# Patient Record
Sex: Female | Born: 1960
Health system: Southern US, Community
[De-identification: ages and names within clinical notes are randomized; demographics above are authoritative.]

## PROBLEM LIST (undated history)

## (undated) DIAGNOSIS — R011 Cardiac murmur, unspecified: Secondary | ICD-10-CM

## (undated) DIAGNOSIS — R51 Headache: Secondary | ICD-10-CM

## (undated) DIAGNOSIS — F32A Depression, unspecified: Secondary | ICD-10-CM

## (undated) DIAGNOSIS — D649 Anemia, unspecified: Secondary | ICD-10-CM

## (undated) DIAGNOSIS — K219 Gastro-esophageal reflux disease without esophagitis: Secondary | ICD-10-CM

## (undated) DIAGNOSIS — M542 Cervicalgia: Secondary | ICD-10-CM

## (undated) DIAGNOSIS — M1990 Unspecified osteoarthritis, unspecified site: Secondary | ICD-10-CM

## (undated) DIAGNOSIS — R519 Headache, unspecified: Secondary | ICD-10-CM

## (undated) DIAGNOSIS — M549 Dorsalgia, unspecified: Secondary | ICD-10-CM

## (undated) DIAGNOSIS — F329 Major depressive disorder, single episode, unspecified: Secondary | ICD-10-CM

## (undated) HISTORY — PX: OTHER SURGICAL HISTORY: SHX169

## (undated) HISTORY — PX: CERVICAL FUSION: SHX112

## (undated) HISTORY — PX: SHOULDER SURGERY: SHX246

## (undated) HISTORY — PX: ABDOMINAL HYSTERECTOMY: SHX81

## (undated) HISTORY — PX: TUBAL LIGATION: SHX77

---

## 1997-11-10 ENCOUNTER — Encounter: Admission: RE | Admit: 1997-11-10 | Discharge: 1998-02-08 | Payer: Self-pay | Admitting: *Deleted

## 1998-02-17 ENCOUNTER — Emergency Department (HOSPITAL_COMMUNITY): Admission: EM | Admit: 1998-02-17 | Discharge: 1998-02-17 | Payer: Self-pay | Admitting: Emergency Medicine

## 1998-02-22 ENCOUNTER — Encounter: Admission: RE | Admit: 1998-02-22 | Discharge: 1998-05-23 | Payer: Self-pay | Admitting: Orthopedic Surgery

## 1999-06-07 ENCOUNTER — Emergency Department (HOSPITAL_COMMUNITY): Admission: EM | Admit: 1999-06-07 | Discharge: 1999-06-07 | Payer: Self-pay | Admitting: *Deleted

## 1999-06-08 ENCOUNTER — Encounter: Payer: Self-pay | Admitting: *Deleted

## 1999-07-03 ENCOUNTER — Ambulatory Visit (HOSPITAL_COMMUNITY): Admission: RE | Admit: 1999-07-03 | Discharge: 1999-07-03 | Payer: Self-pay | Admitting: Obstetrics and Gynecology

## 1999-07-03 ENCOUNTER — Encounter (INDEPENDENT_AMBULATORY_CARE_PROVIDER_SITE_OTHER): Payer: Self-pay

## 1999-10-11 ENCOUNTER — Other Ambulatory Visit: Admission: RE | Admit: 1999-10-11 | Discharge: 1999-10-11 | Payer: Self-pay | Admitting: Obstetrics and Gynecology

## 2000-02-15 ENCOUNTER — Emergency Department (HOSPITAL_COMMUNITY): Admission: EM | Admit: 2000-02-15 | Discharge: 2000-02-16 | Payer: Self-pay

## 2000-02-28 ENCOUNTER — Emergency Department (HOSPITAL_COMMUNITY): Admission: EM | Admit: 2000-02-28 | Discharge: 2000-02-28 | Payer: Self-pay | Admitting: Emergency Medicine

## 2000-10-13 ENCOUNTER — Other Ambulatory Visit: Admission: RE | Admit: 2000-10-13 | Discharge: 2000-10-13 | Payer: Self-pay | Admitting: Obstetrics and Gynecology

## 2001-11-12 ENCOUNTER — Other Ambulatory Visit: Admission: RE | Admit: 2001-11-12 | Discharge: 2001-11-12 | Payer: Self-pay | Admitting: Obstetrics and Gynecology

## 2002-11-15 ENCOUNTER — Other Ambulatory Visit: Admission: RE | Admit: 2002-11-15 | Discharge: 2002-11-15 | Payer: Self-pay | Admitting: Obstetrics and Gynecology

## 2004-11-12 ENCOUNTER — Other Ambulatory Visit: Admission: RE | Admit: 2004-11-12 | Discharge: 2004-11-12 | Payer: Self-pay | Admitting: Obstetrics and Gynecology

## 2004-12-26 ENCOUNTER — Emergency Department (HOSPITAL_COMMUNITY): Admission: EM | Admit: 2004-12-26 | Discharge: 2004-12-26 | Payer: Self-pay | Admitting: Emergency Medicine

## 2005-01-08 ENCOUNTER — Encounter: Admission: RE | Admit: 2005-01-08 | Discharge: 2005-02-25 | Payer: Self-pay | Admitting: Occupational Medicine

## 2005-11-13 ENCOUNTER — Other Ambulatory Visit: Admission: RE | Admit: 2005-11-13 | Discharge: 2005-11-13 | Payer: Self-pay | Admitting: Obstetrics and Gynecology

## 2006-06-03 ENCOUNTER — Ambulatory Visit (HOSPITAL_COMMUNITY): Admission: RE | Admit: 2006-06-03 | Discharge: 2006-06-03 | Payer: Self-pay | Admitting: Obstetrics and Gynecology

## 2006-06-03 ENCOUNTER — Encounter (INDEPENDENT_AMBULATORY_CARE_PROVIDER_SITE_OTHER): Payer: Self-pay | Admitting: Specialist

## 2010-12-28 NOTE — H&P (Signed)
[name], [name]                ACCOUNT NO.:  [id number]   MEDICAL RECORD NO.:  [id number]          PATIENT TYPE:  AMB   LOCATION:  SDC                           FACILITY:  [facility]   PHYSICIAN:  Zenaida Niece, M.D.DATE OF BIRTH:  1961/07/14   DATE OF ADMISSION:  06/03/2006  DATE OF DISCHARGE:                                HISTORY & PHYSICAL   CHIEF COMPLAINT:  Menorrhagia and dysmenorrhea.   HISTORY OF PRESENT ILLNESS:  This is a 50 year old black female, para 3-0-0-  3, who was seen for an annual exam in April 2007.  She has had a  longstanding history of menorrhagia and dysmenorrhea, and at that time had  breakthrough bleeding on the patch and Loestrin 24 but was currently at that  time doing well on Yasmin.  All options were discussed with her, and she  decided to continue the Yasmin.  She then started having irregular bleeding  on the Yasmin and has expressed the desire to proceed with definitive  surgical therapy.  Endometrial biopsy performed on October 16 reveals benign  endometrial tissue with focal features suggestive of an endometrial polyp.  Again, all options have been discussed with the patient, and she wishes to  proceed with definitive surgical therapy.   PAST OB HISTORY:  Significant for 3 vaginal deliveries at term without  complications.   PAST GYN HISTORY:  The patient does have a history of pelvic pain, and in  2000 underwent a laparoscopy with ovarian biopsy and fulguration of lesions  possibly consistent with endometriosis.  She was on Depo-Provera for several  years after that but changed to the birth control patch in 2005 due to the  elevated risk of bone loss with continued use of Depo-Provera.   PAST MEDICAL HISTORY:  Otherwise negative.   PAST SURGICAL HISTORY:  Significant for left shoulder surgery, the  diagnostic laparoscopy, a tubal ligation, and toe surgery.   ALLERGIES:  None known.   CURRENT MEDICATIONS:  Hydrocodone p.r.n.   SOCIAL  HISTORY:  She is married, and she denies alcohol, tobacco, or drug  use.   FAMILY HISTORY:  No GYN or colon cancer.   REVIEW OF SYSTEMS:  She has normal bowel and bladder function.   PHYSICAL EXAMINATION:  VITAL SIGNS: Weight is approximately 140 pounds.  Vital signs are stable.  GENERAL: This is a well-developed black female in no acute distress.  NECK: Supple without lymphadenopathy or thyromegaly.  LUNGS: Clear to auscultation.  HEART:  Regular rate and rhythm without murmur.  ABDOMEN: Soft, nontender, nondistended without palpable masses.  EXTREMITIES:  Have no edema and are nontender.  PELVIC:  External genitalia has no lesion.  On speculum exam, she has a  normal cervix, and Pap smears have been normal with negative high-risk HPV  in 2007.  Bimanual exam shows a small retroverted, nontender uterus and no  adnexal masses, and this is confirmed by rectovaginal exam.   ASSESSMENT:  Persistent menorrhagia and dysmenorrhea with a possible  endometrial polyp by endometrial biopsy.  All options have been discussed  with the patient, and she wishes  to proceed with definitive surgical  therapy.  Risks of surgery have been discussed.   PLAN:  Admit the patient on the day of surgery for laparoscopic  supracervical hysterectomy.      Zenaida Niece, M.D.  Electronically Signed     TDM/MEDQ  D:  06/02/2006  T:  06/02/2006  Job:  [id number]

## 2010-12-28 NOTE — Op Note (Signed)
[name], [name]                ACCOUNT NO.:  [id number]   MEDICAL RECORD NO.:  [id number]          PATIENT TYPE:  OIB   LOCATION:  9316                          [facility]ACILITY:  [facility]   PHYSICIAN:  Zenaida Niece, M.D.DATE OF BIRTH:  1960/10/06   DATE OF PROCEDURE:  06/03/2006  DATE OF DISCHARGE:  06/03/2006                                 OPERATIVE REPORT   PREOPERATIVE AND POSTOPERATIVE DIAGNOSES:  Menorrhagia and dysmenorrhea.   PROCEDURE:  Laparoscopic supracervical hysterectomy.   SURGEON:  Zenaida Niece, M.D.   ASSISTANT:  Sherron Monday, MD   ANESTHESIA:  General endotracheal tube.   SPECIMENS:  Uterus in pieces sent to pathology.   ESTIMATED BLOOD LOSS:  100 mL.   COMPLICATIONS:  None.   FINDINGS:  She had normal appearing appendix, liver edge, gallbladder and  stomach.  Pelvis had normal tubes and ovaries with evidence of prior tubal  ligation.  Uterus was retroverted and appeared to have possible fibroids.   PROCEDURE IN DETAIL:  The patient was taken to the operating room and placed  in the dorsosupine position.  General anesthesia was induced and she was  placed in mobile stirrups and both arms tucked to her side.  The abdomen,  perineum and vagina were then prepped and draped in the usual sterile  fashion and a Foley catheter inserted.  Her infraumbilical skin was  infiltrated with quarter percent Marcaine and 1 cm horizontal incision was  made in her prior incision.  I eventually needed a disposable Veress needle  to enter the peritoneal cavity and this was confirmed by the water drop test  and opening pressure 6 mmHg.  CO2 gas was insufflated to a pressure of 14  mmHg and the Veress needle was removed.  A 5-mm XL trocar was then  introduced through this incision with direct visualization with the  laparoscope.  Inspection revealed the above-mentioned findings.  A 5-mm port  was then placed on the right side under direct visualization and a 12-mm  port on the  left side under direct visualization.  From the left side, the  uterus was grasped with a single-tooth tenaculum and the utero-ovarian  pedicles, round ligament and broad ligaments were taken down with harmonic  scalpel Ace.  The anterior peritoneum was incised across the anterior  portion of the uterus and the bladder pushed inferior.  The uterine arteries  were skeletonized but not taken at this time.  A similar procedure was then  performed from the patient's right side.  The uterine arteries were  skeletonized and taken down with harmonic scalpel Ace with adequate  hemostasis.  Attention was turned back to the left side where the uterine  arteries were skeletonized and taken down with a harmonic scalpel.  Two  bites were taken into the lower uterine segment with a drill, clamp, cut  technique with the harmonic scalpel Ace.  We then returned to the right side  and came across most of the cervix from the right side just above the level  of the uterosacral ligaments.  Then came back from the left  side and  connected to the incision made from the right side and the uterus was freed  from the cervix.  The pelvis was copiously irrigated and bleeding from the  patient's right side cervix was controlled with bipolar cautery and with  Harmonic scalpel.  The 12 mm trocar was then removed and the morcellator was  placed with direct visualization.  The uterus was then removed in several  pieces with good visualization through the morcellator.  Small pieces were  removed with a laparoscopic spoon.  Pelvis was copiously irrigated and found  to be hemostatic.  Tubes and ovaries appeared normal.  A piece of Interceed  was then placed over the cervical stump after the endocervical canal had  been coagulated with the harmonic scalpel Ace.  This achieved good closure  and there was adequate hemostasis.  Laparoscopic trocars were removed under  direct visualization.  All gas allowed to deflate from the  abdomen.  The  fascia on the 12 mm port was closed with figure-of-eight suture of 0 Vicryl.  Skin incisions were closed with interrupted subcuticular sutures of 4-0  Vicryl followed by Dermabond.  The Foley catheter was then removed.  The  patient was extubated in the operating room and taken to the recovery room  in stable condition after tolerating the procedure well.  Counts were  correct x2, she received Mefoxin 1 gram IV prior to the procedure and had  PAS hose on throughout the procedure.      Zenaida Niece, M.D.  Electronically Signed     TDM/MEDQ  D:  06/03/2006  T:  06/04/2006  Job:  [id number]

## 2014-12-19 ENCOUNTER — Other Ambulatory Visit: Payer: Self-pay | Admitting: Orthopedic Surgery

## 2014-12-19 DIAGNOSIS — M5412 Radiculopathy, cervical region: Secondary | ICD-10-CM

## 2014-12-28 ENCOUNTER — Inpatient Hospital Stay
Admission: RE | Admit: 2014-12-28 | Discharge: 2014-12-28 | Disposition: A | Discharge status: Home / Self Care | Payer: Self-pay | Source: Ambulatory Visit | Attending: Orthopedic Surgery | Admitting: Orthopedic Surgery

## 2014-12-28 ENCOUNTER — Other Ambulatory Visit: Payer: Self-pay

## 2014-12-28 NOTE — Discharge Instructions (Signed)

## 2015-01-02 ENCOUNTER — Other Ambulatory Visit: Payer: Self-pay | Admitting: Orthopedic Surgery

## 2015-01-02 ENCOUNTER — Ambulatory Visit
Admission: RE | Admit: 2015-01-02 | Discharge: 2015-01-02 | Disposition: A | Discharge status: Home / Self Care | Payer: Worker's Compensation | Source: Ambulatory Visit | Attending: Orthopedic Surgery | Admitting: Orthopedic Surgery

## 2015-01-02 ENCOUNTER — Ambulatory Visit
Admission: RE | Admit: 2015-01-02 | Discharge: 2015-01-02 | Disposition: A | Discharge status: Home / Self Care | Payer: Self-pay | Source: Ambulatory Visit | Attending: Orthopedic Surgery | Admitting: Orthopedic Surgery

## 2015-01-02 DIAGNOSIS — M5412 Radiculopathy, cervical region: Secondary | ICD-10-CM

## 2015-01-02 MED ORDER — IOHEXOL 300 MG/ML  SOLN
10.0000 mL | Freq: Once | INTRAMUSCULAR | Status: AC | PRN
  Administered 2015-01-02: 10 mL via INTRATHECAL

## 2015-01-02 MED ORDER — DIAZEPAM 5 MG PO TABS
10.0000 mg | ORAL_TABLET | Freq: Once | ORAL | Status: AC
  Administered 2015-01-02: 5 mg via ORAL

## 2015-01-02 NOTE — Discharge Instructions (Signed)

## 2015-04-25 ENCOUNTER — Emergency Department (HOSPITAL_COMMUNITY)
Admission: EM | Admit: 2015-04-25 | Discharge: 2015-04-25 | Disposition: A | Discharge status: Home / Self Care | Payer: Federal, State, Local not specified - PPO | Attending: Emergency Medicine | Admitting: Emergency Medicine

## 2015-04-25 ENCOUNTER — Encounter (HOSPITAL_COMMUNITY): Payer: Self-pay | Admitting: *Deleted

## 2015-04-25 ENCOUNTER — Emergency Department (HOSPITAL_COMMUNITY): Payer: Federal, State, Local not specified - PPO

## 2015-04-25 DIAGNOSIS — S3992XA Unspecified injury of lower back, initial encounter: Secondary | ICD-10-CM | POA: Insufficient documentation

## 2015-04-25 DIAGNOSIS — M545 Low back pain, unspecified: Secondary | ICD-10-CM

## 2015-04-25 DIAGNOSIS — S0990XA Unspecified injury of head, initial encounter: Secondary | ICD-10-CM | POA: Insufficient documentation

## 2015-04-25 DIAGNOSIS — Y998 Other external cause status: Secondary | ICD-10-CM | POA: Diagnosis not present

## 2015-04-25 DIAGNOSIS — Y92410 Unspecified street and highway as the place of occurrence of the external cause: Secondary | ICD-10-CM | POA: Diagnosis not present

## 2015-04-25 DIAGNOSIS — Y9389 Activity, other specified: Secondary | ICD-10-CM | POA: Diagnosis not present

## 2015-04-25 DIAGNOSIS — Z8739 Personal history of other diseases of the musculoskeletal system and connective tissue: Secondary | ICD-10-CM | POA: Diagnosis not present

## 2015-04-25 HISTORY — DX: Unspecified osteoarthritis, unspecified site: M19.90

## 2015-04-25 HISTORY — DX: Cervicalgia: M54.2

## 2015-04-25 HISTORY — DX: Dorsalgia, unspecified: M54.9

## 2015-04-25 MED ORDER — PREDNISONE 20 MG PO TABS: 40.0000 mg | ORAL_TABLET | Freq: Every day | ORAL | Status: DC

## 2015-04-25 MED ORDER — KETOROLAC TROMETHAMINE 60 MG/2ML IM SOLN
60.0000 mg | Freq: Once | INTRAMUSCULAR | Status: AC
  Administered 2015-04-25: 60 mg via INTRAMUSCULAR
  Filled 2015-04-25: qty 2
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
## 2015-11-14 DIAGNOSIS — N951 Menopausal and female climacteric states: Secondary | ICD-10-CM | POA: Diagnosis not present

## 2015-11-14 DIAGNOSIS — Z1389 Encounter for screening for other disorder: Secondary | ICD-10-CM | POA: Diagnosis not present

## 2015-11-14 DIAGNOSIS — Z6826 Body mass index (BMI) 26.0-26.9, adult: Secondary | ICD-10-CM | POA: Diagnosis not present

## 2015-11-14 DIAGNOSIS — Z130 Encounter for screening for diseases of the blood and blood-forming organs and certain disorders involving the immune mechanism: Secondary | ICD-10-CM | POA: Diagnosis not present

## 2015-11-14 DIAGNOSIS — Z01419 Encounter for gynecological examination (general) (routine) without abnormal findings: Secondary | ICD-10-CM | POA: Diagnosis not present

## 2015-11-20 DIAGNOSIS — Z1231 Encounter for screening mammogram for malignant neoplasm of breast: Secondary | ICD-10-CM | POA: Diagnosis not present

## 2016-01-04 DIAGNOSIS — Z79899 Other long term (current) drug therapy: Secondary | ICD-10-CM | POA: Diagnosis not present

## 2016-01-04 DIAGNOSIS — I10 Essential (primary) hypertension: Secondary | ICD-10-CM | POA: Diagnosis not present

## 2016-01-04 DIAGNOSIS — D649 Anemia, unspecified: Secondary | ICD-10-CM | POA: Diagnosis not present

## 2016-01-04 DIAGNOSIS — E785 Hyperlipidemia, unspecified: Secondary | ICD-10-CM | POA: Diagnosis not present

## 2016-02-22 DIAGNOSIS — K080 Exfoliation of teeth due to systemic causes: Secondary | ICD-10-CM | POA: Diagnosis not present

## 2016-06-26 DIAGNOSIS — K080 Exfoliation of teeth due to systemic causes: Secondary | ICD-10-CM | POA: Diagnosis not present

## 2016-07-29 DIAGNOSIS — J209 Acute bronchitis, unspecified: Secondary | ICD-10-CM | POA: Diagnosis not present

## 2016-09-12 ENCOUNTER — Other Ambulatory Visit: Payer: Self-pay | Admitting: Orthopedic Surgery

## 2016-09-18 NOTE — Pre-Procedure Instructions (Addendum)
    [name]  09/18/2016      [id number] [name], [place] [address] Phone: [phone] Fax: [phone]    Your procedure is scheduled on September 26, 2016   Report to [facility]             (posted surgery time 7:30 am - 9:32 am)   Call this number if you have problems the morning of surgery:  [phone]   Remember:  Do not eat food or drink liquids after midnight.  Take these medicines the morning of surgery with A SIP OF WATER ceterizine (zyrtec), Duloxetine (cymbalta).             4-5 days prior to surgery, STOP taking any Vitamins, Herbal Supplements, Anti-inflammatories.   Do not wear jewelry, make-up or nail polish.  Do not wear lotions, powders, or perfumes, or deoderant.  Do not shave 48 hours prior to surgery.  Do not bring valuables to the hospital.  [facility] is not responsible for any belongings or valuables.  Contacts, dentures or bridgework may not be worn into surgery.  Leave your suitcase in the car.  After surgery it may be brought to your room. For patients admitted to the hospital, discharge time will be determined by your treatment team.  Please read over the following fact sheets that you were given. Pain Booklet, MRSA Information and Surgical Site Infection Prevention

## 2016-09-19 ENCOUNTER — Encounter (HOSPITAL_COMMUNITY)
Admission: RE | Admit: 2016-09-19 | Discharge: 2016-09-19 | Disposition: A | Discharge status: Home / Self Care | Payer: Federal, State, Local not specified - PPO | Source: Ambulatory Visit | Attending: Orthopedic Surgery | Admitting: Orthopedic Surgery

## 2016-09-19 ENCOUNTER — Encounter (HOSPITAL_COMMUNITY): Payer: Self-pay

## 2016-09-19 ENCOUNTER — Ambulatory Visit (HOSPITAL_COMMUNITY)
Admission: RE | Admit: 2016-09-19 | Discharge: 2016-09-19 | Disposition: A | Discharge status: Home / Self Care | Source: Ambulatory Visit | Attending: Orthopedic Surgery | Admitting: Orthopedic Surgery

## 2016-09-19 ENCOUNTER — Encounter (HOSPITAL_COMMUNITY): Admission: RE | Admit: 2016-09-19 | Payer: Federal, State, Local not specified - PPO | Source: Ambulatory Visit

## 2016-09-19 DIAGNOSIS — Z01818 Encounter for other preprocedural examination: Secondary | ICD-10-CM | POA: Diagnosis not present

## 2016-09-19 HISTORY — DX: Depression, unspecified: F32.A

## 2016-09-19 HISTORY — DX: Headache, unspecified: R51.9

## 2016-09-19 HISTORY — DX: Cardiac murmur, unspecified: R01.1

## 2016-09-19 HISTORY — DX: Gastro-esophageal reflux disease without esophagitis: K21.9

## 2016-09-19 HISTORY — DX: Anemia, unspecified: D64.9

## 2016-09-19 HISTORY — DX: Headache: R51

## 2016-09-19 HISTORY — DX: Major depressive disorder, single episode, unspecified: F32.9

## 2016-09-19 LAB — PROTIME-INR
INR: 1
Prothrombin Time: 13.2 seconds (ref 11.4–15.2)

## 2016-09-19 LAB — CBC WITH DIFFERENTIAL/PLATELET
BASOS PCT: 1 %
Basophils Absolute: 0.1 10*3/uL (ref 0.0–0.1)
Eosinophils Absolute: 0.1 10*3/uL (ref 0.0–0.7)
Eosinophils Relative: 1 %
HEMATOCRIT: 37.9 % (ref 36.0–46.0)
HEMOGLOBIN: 11.7 g/dL — AB (ref 12.0–15.0)
Lymphocytes Relative: 44 %
Lymphs Abs: 2.4 10*3/uL (ref 0.7–4.0)
MCH: 21 pg — ABNORMAL LOW (ref 26.0–34.0)
MCHC: 30.9 g/dL (ref 30.0–36.0)
MCV: 68.2 fL — ABNORMAL LOW (ref 78.0–100.0)
Monocytes Absolute: 0.2 10*3/uL (ref 0.1–1.0)
Monocytes Relative: 4 %
NEUTROS ABS: 2.7 10*3/uL (ref 1.7–7.7)
NEUTROS PCT: 50 %
Platelets: 268 10*3/uL (ref 150–400)
RBC: 5.56 MIL/uL — ABNORMAL HIGH (ref 3.87–5.11)
RDW: 15.3 % (ref 11.5–15.5)
WBC: 5.5 10*3/uL (ref 4.0–10.5)

## 2016-09-19 LAB — URINALYSIS, ROUTINE W REFLEX MICROSCOPIC
Bilirubin Urine: NEGATIVE
GLUCOSE, UA: NEGATIVE mg/dL
Hgb urine dipstick: NEGATIVE
Ketones, ur: NEGATIVE mg/dL
Leukocytes, UA: NEGATIVE
Nitrite: NEGATIVE
PH: 5 (ref 5.0–8.0)
Protein, ur: NEGATIVE mg/dL
Specific Gravity, Urine: 1.017 (ref 1.005–1.030)

## 2016-09-19 LAB — COMPREHENSIVE METABOLIC PANEL
ALBUMIN: 3.7 g/dL (ref 3.5–5.0)
ALK PHOS: 64 U/L (ref 38–126)
ALT: 18 U/L (ref 14–54)
ANION GAP: 9 (ref 5–15)
AST: 26 U/L (ref 15–41)
BILIRUBIN TOTAL: 0.6 mg/dL (ref 0.3–1.2)
BUN: 11 mg/dL (ref 6–20)
CALCIUM: 9.3 mg/dL (ref 8.9–10.3)
CO2: 25 mmol/L (ref 22–32)
CREATININE: 0.85 mg/dL (ref 0.44–1.00)
Chloride: 105 mmol/L (ref 101–111)
GFR calc Af Amer: >60 mL/min (ref >60)
GFR calc non Af Amer: >60 mL/min (ref >60)
GLUCOSE: 105 mg/dL — AB (ref 65–99)
Potassium: 4.2 mmol/L (ref 3.5–5.1)
Sodium: 139 mmol/L (ref 135–145)
Total Protein: 7 g/dL (ref 6.5–8.1)

## 2016-09-19 LAB — APTT: aPTT: 30 seconds (ref 24–36)

## 2016-09-19 LAB — TYPE AND SCREEN
ABO/RH(D): O POS
ANTIBODY SCREEN: NEGATIVE

## 2016-09-19 LAB — SURGICAL PCR SCREEN
MRSA, PCR: NEGATIVE
Staphylococcus aureus: NEGATIVE

## 2016-09-19 LAB — ABO/RH: ABO/RH(D): O POS

## 2016-09-19 NOTE — Progress Notes (Addendum)
PCP is Dr. Matilde Haymaker @ [facility] practice.  LOV 07/2016  (they do not have an EKG for comparison, so I requested it from the [place]) She also has a [place] [facility]) -- Dr. Henrene Pastor   LOV 05/2016  [phone] Pain management is Dr. Darral Dash LOV 08/2016 She states that more that 15 yrs ago, she was told she had a murmur.  About that time, she also had stress test, but states the results came back 'normal'. Denies any cp, sob.  Has not been back to see any cardio.

## 2016-09-25 DIAGNOSIS — K080 Exfoliation of teeth due to systemic causes: Secondary | ICD-10-CM | POA: Diagnosis not present

## 2016-09-26 ENCOUNTER — Inpatient Hospital Stay (HOSPITAL_COMMUNITY): Admitting: Vascular Surgery

## 2016-09-26 ENCOUNTER — Encounter (HOSPITAL_COMMUNITY): Payer: Self-pay | Admitting: *Deleted

## 2016-09-26 ENCOUNTER — Inpatient Hospital Stay (HOSPITAL_COMMUNITY)

## 2016-09-26 ENCOUNTER — Encounter (HOSPITAL_COMMUNITY)
Admission: RE | Disposition: A | Discharge status: Home / Self Care | Payer: Self-pay | Source: Ambulatory Visit | Attending: Orthopedic Surgery

## 2016-09-26 ENCOUNTER — Inpatient Hospital Stay (HOSPITAL_COMMUNITY)
Admission: RE | Admit: 2016-09-26 | Discharge: 2016-09-27 | DRG: 483 | Disposition: A | Discharge status: Home / Self Care | Source: Ambulatory Visit | Attending: Orthopedic Surgery | Admitting: Orthopedic Surgery

## 2016-09-26 ENCOUNTER — Inpatient Hospital Stay (HOSPITAL_COMMUNITY): Admitting: Certified Registered"

## 2016-09-26 DIAGNOSIS — M12812 Other specific arthropathies, not elsewhere classified, left shoulder: Secondary | ICD-10-CM | POA: Diagnosis present

## 2016-09-26 DIAGNOSIS — Z981 Arthrodesis status: Secondary | ICD-10-CM

## 2016-09-26 DIAGNOSIS — Z91013 Allergy to seafood: Secondary | ICD-10-CM | POA: Diagnosis not present

## 2016-09-26 DIAGNOSIS — F329 Major depressive disorder, single episode, unspecified: Secondary | ICD-10-CM | POA: Diagnosis present

## 2016-09-26 DIAGNOSIS — Z79899 Other long term (current) drug therapy: Secondary | ICD-10-CM | POA: Diagnosis not present

## 2016-09-26 DIAGNOSIS — M75102 Unspecified rotator cuff tear or rupture of left shoulder, not specified as traumatic: Secondary | ICD-10-CM | POA: Diagnosis present

## 2016-09-26 DIAGNOSIS — Z96612 Presence of left artificial shoulder joint: Secondary | ICD-10-CM

## 2016-09-26 DIAGNOSIS — Z7952 Long term (current) use of systemic steroids: Secondary | ICD-10-CM

## 2016-09-26 DIAGNOSIS — K219 Gastro-esophageal reflux disease without esophagitis: Secondary | ICD-10-CM | POA: Diagnosis present

## 2016-09-26 HISTORY — PX: REVERSE SHOULDER ARTHROPLASTY: SHX5054

## 2016-09-26 SURGERY — ARTHROPLASTY, SHOULDER, TOTAL, REVERSE
Anesthesia: General | Laterality: Left

## 2016-09-26 MED ORDER — ZOLPIDEM TARTRATE 5 MG PO TABS: 5.0000 mg | ORAL_TABLET | Freq: Every evening | ORAL | Status: DC | PRN

## 2016-09-26 MED ORDER — TRANEXAMIC ACID 1000 MG/10ML IV SOLN
1000.0000 mg | INTRAVENOUS | Status: AC
  Administered 2016-09-26: 1000 mg via INTRAVENOUS
  Filled 2016-09-26: qty 10

## 2016-09-26 MED ORDER — PRAVASTATIN SODIUM 20 MG PO TABS
20.0000 mg | ORAL_TABLET | Freq: Every evening | ORAL | Status: DC
  Administered 2016-09-26: 20 mg via ORAL
  Filled 2016-09-26: qty 1

## 2016-09-26 MED ORDER — LIDOCAINE 2% (20 MG/ML) 5 ML SYRINGE
INTRAMUSCULAR | Status: AC
  Filled 2016-09-26: qty 5

## 2016-09-26 MED ORDER — ONDANSETRON HCL 4 MG/2ML IJ SOLN
INTRAMUSCULAR | Status: DC | PRN
  Administered 2016-09-26: 4 mg via INTRAVENOUS

## 2016-09-26 MED ORDER — LORATADINE 10 MG PO TABS
10.0000 mg | ORAL_TABLET | Freq: Every day | ORAL | Status: DC
Start: 2016-09-26 — End: 2016-09-27
  Administered 2016-09-26 – 2016-09-27 (×2): 10 mg via ORAL
  Filled 2016-09-26 (×2): qty 1

## 2016-09-26 MED ORDER — PROPOFOL 10 MG/ML IV BOLUS
INTRAVENOUS | Status: DC | PRN
  Administered 2016-09-26: 20 mg via INTRAVENOUS
  Administered 2016-09-26: 130 mg via INTRAVENOUS

## 2016-09-26 MED ORDER — ACETAMINOPHEN 325 MG PO TABS
650.0000 mg | ORAL_TABLET | Freq: Four times a day (QID) | ORAL | Status: DC | PRN
  Administered 2016-09-27: 650 mg via ORAL
  Filled 2016-09-26: qty 2

## 2016-09-26 MED ORDER — ONDANSETRON HCL 4 MG/2ML IJ SOLN: 4.0000 mg | Freq: Four times a day (QID) | INTRAMUSCULAR | Status: DC | PRN

## 2016-09-26 MED ORDER — METOCLOPRAMIDE HCL 5 MG/ML IJ SOLN: 5.0000 mg | Freq: Three times a day (TID) | INTRAMUSCULAR | Status: DC | PRN

## 2016-09-26 MED ORDER — PHENOL 1.4 % MT LIQD: 1.0000 | OROMUCOSAL | Status: DC | PRN

## 2016-09-26 MED ORDER — HYDROMORPHONE HCL 1 MG/ML IJ SOLN: 0.2500 mg | INTRAMUSCULAR | Status: DC | PRN

## 2016-09-26 MED ORDER — DIPHENHYDRAMINE HCL 12.5 MG/5ML PO ELIX: 12.5000 mg | ORAL_SOLUTION | ORAL | Status: DC | PRN

## 2016-09-26 MED ORDER — PROPOFOL 10 MG/ML IV BOLUS
INTRAVENOUS | Status: AC
  Filled 2016-09-26: qty 40

## 2016-09-26 MED ORDER — PHENYLEPHRINE HCL 10 MG/ML IJ SOLN
INTRAVENOUS | Status: DC | PRN
  Administered 2016-09-26: 20 ug/min via INTRAVENOUS

## 2016-09-26 MED ORDER — NEOSTIGMINE METHYLSULFATE 5 MG/5ML IV SOSY
PREFILLED_SYRINGE | INTRAVENOUS | Status: AC
  Filled 2016-09-26: qty 5

## 2016-09-26 MED ORDER — ONDANSETRON HCL 4 MG PO TABS: 4.0000 mg | ORAL_TABLET | Freq: Four times a day (QID) | ORAL | Status: DC | PRN

## 2016-09-26 MED ORDER — FLEET ENEMA 7-19 GM/118ML RE ENEM: 1.0000 | ENEMA | Freq: Once | RECTAL | Status: DC | PRN

## 2016-09-26 MED ORDER — CEFAZOLIN IN D5W 1 GM/50ML IV SOLN
1.0000 g | Freq: Four times a day (QID) | INTRAVENOUS | Status: AC
  Administered 2016-09-26 – 2016-09-27 (×3): 1 g via INTRAVENOUS
  Filled 2016-09-26 (×4): qty 50

## 2016-09-26 MED ORDER — LACTATED RINGERS IV SOLN: INTRAVENOUS | Status: DC

## 2016-09-26 MED ORDER — DEXAMETHASONE SODIUM PHOSPHATE 10 MG/ML IJ SOLN
INTRAMUSCULAR | Status: DC | PRN
  Administered 2016-09-26: 10 mg via INTRAVENOUS

## 2016-09-26 MED ORDER — DOCUSATE SODIUM 100 MG PO CAPS
100.0000 mg | ORAL_CAPSULE | Freq: Two times a day (BID) | ORAL | Status: DC
  Administered 2016-09-26 – 2016-09-27 (×3): 100 mg via ORAL
  Filled 2016-09-26 (×3): qty 1

## 2016-09-26 MED ORDER — ROCURONIUM BROMIDE 50 MG/5ML IV SOSY
PREFILLED_SYRINGE | INTRAVENOUS | Status: AC
  Filled 2016-09-26: qty 5

## 2016-09-26 MED ORDER — MIDAZOLAM HCL 2 MG/2ML IJ SOLN
INTRAMUSCULAR | Status: DC | PRN
Start: 2016-09-26 — End: 2016-09-26
  Administered 2016-09-26: 2 mg via INTRAVENOUS

## 2016-09-26 MED ORDER — BISACODYL 5 MG PO TBEC: 5.0000 mg | DELAYED_RELEASE_TABLET | Freq: Every day | ORAL | Status: DC | PRN

## 2016-09-26 MED ORDER — EPINEPHRINE PF 1 MG/ML IJ SOLN
INTRAMUSCULAR | Status: AC
  Filled 2016-09-26: qty 1

## 2016-09-26 MED ORDER — MEPERIDINE HCL 25 MG/ML IJ SOLN: 6.2500 mg | INTRAMUSCULAR | Status: DC | PRN

## 2016-09-26 MED ORDER — ASPIRIN EC 325 MG PO TBEC
325.0000 mg | DELAYED_RELEASE_TABLET | Freq: Every day | ORAL | Status: DC
  Administered 2016-09-26 – 2016-09-27 (×2): 325 mg via ORAL
  Filled 2016-09-26 (×2): qty 1

## 2016-09-26 MED ORDER — ONDANSETRON HCL 4 MG/2ML IJ SOLN
INTRAMUSCULAR | Status: AC
Start: 2016-09-26 — End: 2016-09-26
  Filled 2016-09-26: qty 2

## 2016-09-26 MED ORDER — MENTHOL 3 MG MT LOZG: 1.0000 | LOZENGE | OROMUCOSAL | Status: DC | PRN

## 2016-09-26 MED ORDER — OXYCODONE HCL 5 MG PO TABS
5.0000 mg | ORAL_TABLET | ORAL | Status: DC | PRN
  Administered 2016-09-26 – 2016-09-27 (×3): 5 mg via ORAL
  Filled 2016-09-26 (×2): qty 1
  Filled 2016-09-26: qty 2

## 2016-09-26 MED ORDER — ACETAMINOPHEN 650 MG RE SUPP: 650.0000 mg | Freq: Four times a day (QID) | RECTAL | Status: DC | PRN

## 2016-09-26 MED ORDER — SODIUM CHLORIDE 0.9 % IV SOLN
INTRAVENOUS | Status: DC
  Administered 2016-09-26: 10:00:00 via INTRAVENOUS

## 2016-09-26 MED ORDER — MORPHINE SULFATE (PF) 2 MG/ML IV SOLN: 1.0000 mg | INTRAVENOUS | Status: DC | PRN

## 2016-09-26 MED ORDER — DULOXETINE HCL 30 MG PO CPEP
30.0000 mg | ORAL_CAPSULE | Freq: Two times a day (BID) | ORAL | Status: DC
  Administered 2016-09-26 – 2016-09-27 (×3): 30 mg via ORAL
  Filled 2016-09-26 (×3): qty 1

## 2016-09-26 MED ORDER — BUPIVACAINE LIPOSOME 1.3 % IJ SUSP
20.0000 mL | Freq: Once | INTRAMUSCULAR | Status: DC
  Filled 2016-09-26: qty 20

## 2016-09-26 MED ORDER — PROMETHAZINE HCL 25 MG/ML IJ SOLN: 6.2500 mg | INTRAMUSCULAR | Status: DC | PRN

## 2016-09-26 MED ORDER — FENTANYL CITRATE (PF) 100 MCG/2ML IJ SOLN
INTRAMUSCULAR | Status: AC
  Filled 2016-09-26: qty 2

## 2016-09-26 MED ORDER — NEOSTIGMINE METHYLSULFATE 10 MG/10ML IV SOLN
INTRAVENOUS | Status: DC | PRN
  Administered 2016-09-26: 3 mg via INTRAVENOUS

## 2016-09-26 MED ORDER — METOCLOPRAMIDE HCL 5 MG PO TABS: 5.0000 mg | ORAL_TABLET | Freq: Three times a day (TID) | ORAL | Status: DC | PRN

## 2016-09-26 MED ORDER — DEXAMETHASONE SODIUM PHOSPHATE 10 MG/ML IJ SOLN
INTRAMUSCULAR | Status: AC
  Filled 2016-09-26: qty 1

## 2016-09-26 MED ORDER — POVIDONE-IODINE 7.5 % EX SOLN
Freq: Once | CUTANEOUS | Status: DC
  Filled 2016-09-26: qty 118

## 2016-09-26 MED ORDER — LIDOCAINE HCL (CARDIAC) 20 MG/ML IV SOLN
INTRAVENOUS | Status: DC | PRN
  Administered 2016-09-26: 50 mg via INTRAVENOUS

## 2016-09-26 MED ORDER — LACTATED RINGERS IV SOLN
INTRAVENOUS | Status: DC | PRN
  Administered 2016-09-26 (×2): via INTRAVENOUS

## 2016-09-26 MED ORDER — BUPIVACAINE HCL (PF) 0.25 % IJ SOLN
INTRAMUSCULAR | Status: AC
  Filled 2016-09-26: qty 30

## 2016-09-26 MED ORDER — ROCURONIUM BROMIDE 100 MG/10ML IV SOLN
INTRAVENOUS | Status: DC | PRN
  Administered 2016-09-26: 50 mg via INTRAVENOUS

## 2016-09-26 MED ORDER — GLYCOPYRROLATE 0.2 MG/ML IJ SOLN
INTRAMUSCULAR | Status: DC | PRN
  Administered 2016-09-26: 0.4 mg via INTRAVENOUS
  Administered 2016-09-26: 0.2 mg via INTRAVENOUS

## 2016-09-26 MED ORDER — BUPIVACAINE-EPINEPHRINE (PF) 0.5% -1:200000 IJ SOLN
INTRAMUSCULAR | Status: DC | PRN
  Administered 2016-09-26: 15 mL via PERINEURAL

## 2016-09-26 MED ORDER — MIDAZOLAM HCL 2 MG/2ML IJ SOLN
INTRAMUSCULAR | Status: AC
  Filled 2016-09-26: qty 2

## 2016-09-26 MED ORDER — SODIUM CHLORIDE 0.9 % IR SOLN
Status: DC | PRN
  Administered 2016-09-26: 3000 mL

## 2016-09-26 MED ORDER — ACETAMINOPHEN 500 MG PO TABS
1000.0000 mg | ORAL_TABLET | Freq: Four times a day (QID) | ORAL | Status: AC
  Administered 2016-09-26 – 2016-09-27 (×4): 1000 mg via ORAL
  Filled 2016-09-26 (×4): qty 2

## 2016-09-26 MED ORDER — CEFAZOLIN SODIUM-DEXTROSE 2-4 GM/100ML-% IV SOLN
2.0000 g | INTRAVENOUS | Status: AC
  Administered 2016-09-26: 2 g via INTRAVENOUS
  Filled 2016-09-26: qty 100

## 2016-09-26 MED ORDER — FENTANYL CITRATE (PF) 100 MCG/2ML IJ SOLN
INTRAMUSCULAR | Status: DC | PRN
  Administered 2016-09-26 (×2): 50 ug via INTRAVENOUS

## 2016-09-26 MED ORDER — BUPIVACAINE LIPOSOME 1.3 % IJ SUSP
INTRAMUSCULAR | Status: DC | PRN
  Administered 2016-09-26: 10 mL

## 2016-09-26 MED ORDER — ALUM & MAG HYDROXIDE-SIMETH 200-200-20 MG/5ML PO SUSP: 30.0000 mL | ORAL | Status: DC | PRN

## 2016-09-26 MED ORDER — POLYETHYLENE GLYCOL 3350 17 G PO PACK: 17.0000 g | PACK | Freq: Every day | ORAL | Status: DC | PRN

## 2016-09-26 MED ORDER — SODIUM CHLORIDE 0.9 % IJ SOLN
INTRAMUSCULAR | Status: DC | PRN
Start: 2016-09-26 — End: 2016-09-26
  Administered 2016-09-26: 30 mL via INTRAVENOUS

## 2016-09-26 SURGICAL SUPPLY — 90 items
AID PSTN UNV HD RSTRNT DISP (MISCELLANEOUS) ×1
BASEPLATE P2 COATD GLND 6.5X30 (Shoulder) IMPLANT
BIT DRILL 5/64X5 DISP (BIT) ×1 IMPLANT
BLADE SAW SAG 73X25 THK (BLADE) ×1
BLADE SAW SGTL 73X25 THK (BLADE) ×1 IMPLANT
BLADE SURG 15 STRL LF DISP TIS (BLADE) IMPLANT
BLADE SURG 15 STRL SS (BLADE)
BOWL SMART MIX CTS (DISPOSABLE) IMPLANT
BSPLAT GLND 30 STRL LF SHLDR (Shoulder) ×1 IMPLANT
CHLORAPREP W/TINT 26ML (MISCELLANEOUS) ×3 IMPLANT
COVER MAYO STAND STRL (DRAPES) IMPLANT
COVER SURGICAL LIGHT HANDLE (MISCELLANEOUS) ×2 IMPLANT
DRAPE INCISE IOBAN 66X45 STRL (DRAPES) ×3 IMPLANT
DRAPE ORTHO SPLIT 77X108 STRL (DRAPES) ×4
DRAPE SURG ORHT 6 SPLT 77X108 (DRAPES) ×2 IMPLANT
DRESSING AQUACEL AG SP 3.5X10 (GAUZE/BANDAGES/DRESSINGS) IMPLANT
DRSG AQUACEL AG ADV 3.5X10 (GAUZE/BANDAGES/DRESSINGS) ×2 IMPLANT
DRSG AQUACEL AG SP 3.5X10 (GAUZE/BANDAGES/DRESSINGS) ×2
ELECT BLADE 4.0 EZ CLEAN MEGAD (MISCELLANEOUS) ×2
ELECT REM PT RETURN 9FT ADLT (ELECTROSURGICAL) ×2
ELECTRODE BLDE 4.0 EZ CLN MEGD (MISCELLANEOUS) ×1 IMPLANT
ELECTRODE REM PT RTRN 9FT ADLT (ELECTROSURGICAL) ×1 IMPLANT
EVACUATOR 1/8 PVC DRAIN (DRAIN) IMPLANT
GLOVE BIO SURGEON STRL SZ7 (GLOVE) ×2 IMPLANT
GLOVE BIO SURGEON STRL SZ7.5 (GLOVE) ×2 IMPLANT
GLOVE BIOGEL M 6.5 STRL (GLOVE) ×1 IMPLANT
GLOVE BIOGEL M 8.0 STRL (GLOVE) ×2 IMPLANT
GLOVE BIOGEL M STRL SZ7.5 (GLOVE) ×2 IMPLANT
GLOVE BIOGEL PI IND STRL 6.5 (GLOVE) IMPLANT
GLOVE BIOGEL PI IND STRL 7.0 (GLOVE) ×1 IMPLANT
GLOVE BIOGEL PI IND STRL 8 (GLOVE) ×1 IMPLANT
GLOVE BIOGEL PI INDICATOR 6.5 (GLOVE) ×1
GLOVE BIOGEL PI INDICATOR 7.0 (GLOVE) ×2
GLOVE BIOGEL PI INDICATOR 8 (GLOVE) ×2
GOWN STRL REUS W/ TWL LRG LVL3 (GOWN DISPOSABLE) ×3 IMPLANT
GOWN STRL REUS W/ TWL XL LVL3 (GOWN DISPOSABLE) ×1 IMPLANT
GOWN STRL REUS W/TWL LRG LVL3 (GOWN DISPOSABLE) ×6
GOWN STRL REUS W/TWL XL LVL3 (GOWN DISPOSABLE) ×2
HANDPIECE INTERPULSE COAX TIP (DISPOSABLE) ×2
HOOD PEEL AWAY FLYTE STAYCOOL (MISCELLANEOUS) ×5 IMPLANT
INSERT EPOLY STND HUMERUS 32MM (Shoulder) ×2 IMPLANT
INSERT EPOLYSTD HUMERUS 32MM (Shoulder) IMPLANT
KIT BASIN OR (CUSTOM PROCEDURE TRAY) ×2 IMPLANT
KIT ROOM TURNOVER OR (KITS) ×2 IMPLANT
MANIFOLD NEPTUNE II (INSTRUMENTS) ×2 IMPLANT
NDL HYPO 25GX1X1/2 BEV (NEEDLE) IMPLANT
NDL MAYO TROCAR (NEEDLE) IMPLANT
NEEDLE HYPO 25GX1X1/2 BEV (NEEDLE) IMPLANT
NEEDLE MAYO TROCAR (NEEDLE) ×2 IMPLANT
NOZZLE PRISM 8.5MM (MISCELLANEOUS) IMPLANT
NS IRRIG 1000ML POUR BTL (IV SOLUTION) ×2 IMPLANT
P2 COATDE GLNOID BSEPLT 6.5X30 (Shoulder) ×2 IMPLANT
PACK SHOULDER (CUSTOM PROCEDURE TRAY) ×2 IMPLANT
PAD ARMBOARD 7.5X6 YLW CONV (MISCELLANEOUS) ×4 IMPLANT
RESTRAINT HEAD UNIVERSAL NS (MISCELLANEOUS) ×2 IMPLANT
RETRIEVER SUT HEWSON (MISCELLANEOUS) IMPLANT
SCREW BONE LOCKING RSP 5.0X14 (Screw) ×2 IMPLANT
SCREW BONE LOCKING RSP 5.0X30 (Screw) ×2 IMPLANT
SCREW BONE RSP LOCK 5X14 (Screw) IMPLANT
SCREW BONE RSP LOCK 5X18 (Screw) IMPLANT
SCREW BONE RSP LOCK 5X26 (Screw) IMPLANT
SCREW BONE RSP LOCK 5X30 (Screw) IMPLANT
SCREW BONE RSP LOCKING 18MM LG (Screw) ×2 IMPLANT
SCREW BONE RSP LOCKING 5.0X26 (Screw) ×2 IMPLANT
SCREW RETAIN W/HEAD 4MM OFFSET (Shoulder) ×1 IMPLANT
SET HNDPC FAN SPRY TIP SCT (DISPOSABLE) ×1 IMPLANT
SLING ARM FOAM STRAP LRG (SOFTGOODS) ×2 IMPLANT
SLING ARM FOAM STRAP MED (SOFTGOODS) ×1 IMPLANT
SPONGE LAP 18X18 X RAY DECT (DISPOSABLE) ×2 IMPLANT
SPONGE LAP 4X18 X RAY DECT (DISPOSABLE) ×1 IMPLANT
STEM REV PRIMARY 8X108 (Shoulder) ×1 IMPLANT
STRIP CLOSURE SKIN 1/2X4 (GAUZE/BANDAGES/DRESSINGS) ×2 IMPLANT
SUCTION FRAZIER HANDLE 10FR (MISCELLANEOUS) ×1
SUCTION TUBE FRAZIER 10FR DISP (MISCELLANEOUS) ×1 IMPLANT
SUPPORT WRAP ARM LG (MISCELLANEOUS) ×2 IMPLANT
SUT ETHIBOND NAB CT1 #1 30IN (SUTURE) ×2 IMPLANT
SUT FIBERWIRE #2 38 T-5 BLUE (SUTURE) ×2
SUT MNCRL AB 4-0 PS2 18 (SUTURE) ×2 IMPLANT
SUT SILK 2 0 TIES 17X18 (SUTURE) ×2
SUT SILK 2-0 18XBRD TIE BLK (SUTURE) IMPLANT
SUT VIC AB 2-0 CT1 27 (SUTURE) ×2
SUT VIC AB 2-0 CT1 TAPERPNT 27 (SUTURE) ×1 IMPLANT
SUTURE FIBERWR #2 38 T-5 BLUE (SUTURE) ×1 IMPLANT
SYR CONTROL 10ML LL (SYRINGE) IMPLANT
SYRINGE TOOMEY DISP (SYRINGE) IMPLANT
TAPE FIBER 2MM 7IN #2 BLUE (SUTURE) IMPLANT
TOWEL OR 17X24 6PK STRL BLUE (TOWEL DISPOSABLE) ×1 IMPLANT
TOWEL OR 17X26 10 PK STRL BLUE (TOWEL DISPOSABLE) ×1 IMPLANT
WATER STERILE IRR 1000ML POUR (IV SOLUTION) ×2 IMPLANT
YANKAUER SUCT BULB TIP NO VENT (SUCTIONS) IMPLANT

## 2016-09-26 NOTE — Anesthesia Procedure Notes (Signed)
Procedure Name: Intubation Date/Time: 09/26/2016 7:44 AM Performed by: Adalberto Ill Pre-anesthesia Checklist: Patient identified, Emergency Drugs available, Suction available, Patient being monitored and Timeout performed Patient Re-evaluated:Patient Re-evaluated prior to inductionOxygen Delivery Method: Circle system utilized Preoxygenation: Pre-oxygenation with 100% oxygen Intubation Type: IV induction Ventilation: Mask ventilation without difficulty Laryngoscope Size: Miller and 2 Grade View: Grade I Tube type: Oral Tube size: 7.0 mm Number of attempts: 1 Airway Equipment and Method: Stylet Secured at: 22 cm Tube secured with: Tape Dental Injury: Teeth and Oropharynx as per pre-operative assessment

## 2016-09-26 NOTE — Anesthesia Preprocedure Evaluation (Addendum)
Anesthesia Evaluation  Patient identified by MRN, date of birth, ID band Patient awake    Reviewed: Allergy & Precautions, NPO status , Patient's Chart, lab work & pertinent test results  Airway Mallampati: I  TM Distance: >3 FB Neck ROM: Full    Dental  (+) Partial Upper   Pulmonary neg pulmonary ROS,    breath sounds clear to auscultation       Cardiovascular  Rhythm:Regular Rate:Normal     Neuro/Psych  Headaches, PSYCHIATRIC DISORDERS Depression    GI/Hepatic Neg liver ROS, GERD  ,  Endo/Other  negative endocrine ROS  Renal/GU negative Renal ROS  negative genitourinary   Musculoskeletal  (+) Arthritis ,   Abdominal   Peds negative pediatric ROS (+)  Hematology negative hematology ROS (+)   Anesthesia Other Findings   Reproductive/Obstetrics negative OB ROS                            Lab Results  Component Value Date   WBC 5.5 09/19/2016   HGB 11.7 (L) 09/19/2016   HCT 37.9 09/19/2016   MCV 68.2 (L) 09/19/2016   PLT 268 09/19/2016   Lab Results  Component Value Date   CREATININE 0.85 09/19/2016   BUN 11 09/19/2016   NA 139 09/19/2016   K 4.2 09/19/2016   CL 105 09/19/2016   CO2 25 09/19/2016   Lab Results  Component Value Date   INR 1.00 09/19/2016   EKG: normal sinus rhythm.   Anesthesia Physical Anesthesia Plan  ASA: II  Anesthesia Plan: General   Post-op Pain Management: GA combined w/ Regional for post-op pain   Induction: Intravenous  Airway Management Planned: Oral ETT  Additional Equipment:   Intra-op Plan:   Post-operative Plan: Extubation in OR  Informed Consent: I have reviewed the patients History and Physical, chart, labs and discussed the procedure including the risks, benefits and alternatives for the proposed anesthesia with the patient or authorized representative who has indicated his/her understanding and acceptance.   Dental advisory  given  Plan Discussed with: CRNA  Anesthesia Plan Comments:         Anesthesia Quick Evaluation

## 2016-09-26 NOTE — H&P (Signed)
[name] is an 56 y.o. female.   Chief Complaint: L shoulder pain and dysfunction HPI: L shoulder rotator cuff tear arthropathy.  Failed conservative management with NSAIDS, activity modification, arthroscopic debridement x 2.   Past Medical History:  Diagnosis Date  . Anemia   . Arthritis   . Back pain   . Depression   . GERD (gastroesophageal reflux disease)   . Headache    migraines   Last time "been awhile"  . Heart murmur    15 - 20 yrs ago.  No problems now  . Neck pain     Past Surgical History:  Procedure Laterality Date  . ABDOMINAL HYSTERECTOMY    . bunyonectomy    . CERVICAL FUSION    . SHOULDER SURGERY    . TUBAL LIGATION      History reviewed. No pertinent family history. Social History:  reports that she has never smoked. She has never used smokeless tobacco. She reports that she drinks alcohol. She reports that she does not use drugs.  Allergies:  Allergies  Allergen Reactions  . Shellfish Allergy Itching    Medications Prior to Admission  Medication Sig Dispense Refill  . aspirin-acetaminophen-caffeine (EXCEDRIN MIGRAINE) 250-250-65 MG tablet Take 2 tablets by mouth every 6 (six) hours as needed for headache.    . cetirizine (ZYRTEC) 10 MG tablet Take 10 mg by mouth daily.    . DULoxetine (CYMBALTA) 30 MG capsule Take 30 mg by mouth 2 (two) times daily.    . Ferrous Sulfate (IRON) 90 (18 Fe) MG TABS Take 1 tablet by mouth daily.    Marland Kitchen LIDOCAINE EX Apply 1 application topically 2 (two) times daily as needed. Pain    . meloxicam (MOBIC) 15 MG tablet Take 15 mg by mouth daily.    . Multiple Vitamin (THERA) TABS Take 1 tablet by mouth daily.    . pravastatin (PRAVACHOL) 10 MG tablet Take 20 mg by mouth every evening.    . predniSONE (DELTASONE) 20 MG tablet Take 2 tablets (40 mg total) by mouth daily. Take 40 mg by mouth daily for 3 days, then 20mg  by mouth daily for 3 days, then 10mg  daily for 3 days (Patient not taking: Reported on 09/16/2016) 12 tablet 0     No results found for this or any previous visit (from the past 48 hour(s)). No results found.  Review of Systems  All other systems reviewed and are negative.   Blood pressure (!) 133/53, pulse 61, temperature 98.4 F (36.9 C), temperature source Oral, resp. rate 18, height 5\' 4"  (1.626 m), weight 60.8 kg (134 lb), SpO2 100 %. Physical Exam  Constitutional: She is oriented to person, place, and time. She appears well-developed and well-nourished.  HENT:  Head: Atraumatic.  Eyes: EOM are normal.  Cardiovascular: Intact distal pulses.   Respiratory: Effort normal.  Musculoskeletal:  L shoulder pain with limited ROM.  Neurological: She is alert and oriented to person, place, and time.  Skin: Skin is warm and dry.  Psychiatric: She has a normal mood and affect.     Assessment/Plan L shoulder rotator cuff tear arthropathy.  Failed conservative management with NSAIDS, activity modification, arthroscopic debridement x 2.  Plan L reverse TSA Risks / benefits of surgery discussed Consent on chart  NPO for OR Preop antibiotics   Nita Sells, MD 09/26/2016, 7:12 AM

## 2016-09-26 NOTE — Discharge Instructions (Signed)

## 2016-09-26 NOTE — Op Note (Signed)
Procedure(s): REVERSE SHOULDER ARTHROPLASTY Procedure Note  [name] female 56 y.o. 09/26/2016  Procedure(s) and Anesthesia Type:    * Left REVERSE SHOULDER ARTHROPLASTY - Choice   Indications:  56 y.o. female  With endstage left shoulder arthritis with irrepairable rotator cuff tear. Pain and dysfunction interfered with quality of life and nonoperative treatment with activity modification, NSAIDS and injections failed.     Surgeon: Nita Sells   Assistants: Jeanmarie Hubert PA-C [facility] was present and scrubbed throughout the procedure and was essential in positioning, retraction, exposure, and closure)  Anesthesia: General endotracheal anesthesia with preoperative interscalene block given by the attending anesthesiologist    Procedure Detail  Left REVERSE SHOULDER ARTHROPLASTY   Estimated Blood Loss:  300 mL         Drains: none  Blood Given: none          Specimens: none        Complications:  * No complications entered in OR log *         Disposition: PACU - hemodynamically stable.         Condition: stable      OPERATIVE FINDINGS:  A DJO Altivate pressfit reverse total shoulder arthroplasty was placed with a  size 8 stem, a 32-4 glenosphere, and a standard poly insert. The base plate  fixation was excellent.  PROCEDURE: The patient was identified in the preoperative holding area  where I personally marked the operative site after verifying site, side,  and procedure with the patient. An interscalene block given by  the attending anesthesiologist in the holding area and the patient was taken back to the operating room where all extremities were  carefully padded in position after general anesthesia was induced. She  was placed in a beach-chair position and the operative upper extremity was  prepped and draped in a standard sterile fashion. An approximately 10-  cm incision was made from the tip of the coracoid process to the center   point of the humerus at the level of the axilla. Dissection was carried  down through subcutaneous tissues to the level of the cephalic vein  which was taken laterally with the deltoid. The pectoralis major was  retracted medially. The subdeltoid space was developed and the lateral  edge of the conjoined tendon was identified. The undersurface of  conjoined tendon was palpated and the musculocutaneous nerve was not in  the field. Retractor was placed underneath the conjoined and second  retractor was placed lateral into the deltoid. The circumflex humeral  artery and vessels were identified and clamped and coagulated. The  biceps tendon was tenotomized and tenodesis to the upper border of the subscapularis with 2 Ethibond sutures..  The subscapularis was taken down as a peel with the underlying capsule and noted to be extremely thin..  The  joint was then gently externally rotated while the capsule was released  from the humeral neck around to just beyond the 6 o'clock position. At  this point, the joint was dislocated and the humeral head was presented  into the wound. The excessive osteophyte formation was removed with a  large rongeur.  The cutting guide was used to make the appropriate  head cut and the head was saved for potentially bone grafting.  The glenoid was exposed with the arm in an  abducted extended position. The anterior and posterior labrum were  completely excised and the capsule was released circumferentially to  allow for exposure of the glenoid for preparation. The 2.5  mm drill was  placed using the guide in 5-10 inferior angulation and the tap was then advanced in the same hole. Small and large reamers were then used. The tap was then removed and the Metaglene was then screwed in with excellent purchase.  The peripheral guide was then used to drilled measured and filled peripheral locking screws. The size  32-4  glenosphere was then impacted on the [facility] taper and the  central screw was placed. The humerus was then again exposed and the diaphyseal reamers were used followed by the metaphyseal reamers. The final broach was left in place in the proximal trial was placed. The joint was reduced and with this implant it was felt that soft tissue tensioning was appropriate with excellent stability and excellent range of motion. Therefore, final humeral stem was placed press-fit with bone grafting.  And then the trial polyethylene inserts were tested again and the above implant was felt to be the most appropriate for final insertion. The joint was reduced taken through full range of motion and felt to be stable. Soft tissue tension was appropriate.  The joint was then copiously irrigated with pulse  lavage and the wound was then closed. The subscapularis was thinned and degenerated and was not repaired.  Skin was closed with 2-0 Vicryl in a deep dermal layer and 4-0  Monocryl for skin closure. Steri-Strips were applied. Sterile  dressings were then applied as well as a sling. The patient was allowed  to awaken from general anesthesia, transferred to stretcher, and taken  to recovery room in stable condition.   POSTOPERATIVE PLAN: The patient will be kept in the hospital postoperatively  for pain control and therapy.

## 2016-09-26 NOTE — Anesthesia Procedure Notes (Signed)
Anesthesia Regional Block:  Interscalene brachial plexus block  Pre-Anesthetic Checklist: ,, timeout performed, Correct Patient, Correct Site, Correct Laterality, Correct Procedure, Correct Position, site marked, Risks and benefits discussed,  Surgical consent,  Pre-op evaluation,  At surgeon's request and post-op pain management  Laterality: Left  Prep: chloraprep       Needles:  Injection technique: Single-shot  Needle Type: Echogenic Needle     Needle Length: 9cm 9 cm Needle Gauge: 21 and 21 G    Additional Needles:  Procedures: ultrasound guided (picture in chart) Interscalene brachial plexus block Narrative:  Start time: 09/26/2016 7:20 AM End time: 09/26/2016 7:25 AM Injection made incrementally with aspirations every 5 mL.  Performed by: Personally  Anesthesiologist: Suella Broad D  Additional Notes: Tolerated well.

## 2016-09-26 NOTE — Anesthesia Postprocedure Evaluation (Addendum)
Anesthesia Post Note  Patient: [name]  Procedure(s) Performed: Procedure(s) (LRB): REVERSE SHOULDER ARTHROPLASTY (Left)  Patient location during evaluation: PACU Anesthesia Type: General Level of consciousness: awake and alert Pain management: pain level controlled Vital Signs Assessment: post-procedure vital signs reviewed and stable Respiratory status: spontaneous breathing, nonlabored ventilation, respiratory function stable and patient connected to nasal cannula oxygen Cardiovascular status: blood pressure returned to baseline and stable Postop Assessment: no signs of nausea or vomiting Anesthetic complications: no       Last Vitals:  Vitals:   09/26/16 1045 09/26/16 1100  BP: 135/70 (!) 135/51  Pulse: (!) 55 62  Resp: 20 16  Temp:  36.3 C    Last Pain:  Vitals:   09/26/16 1015  TempSrc:   PainSc: Asleep                 Effie Berkshire

## 2016-09-26 NOTE — Transfer of Care (Signed)
Immediate Anesthesia Transfer of Care Note  Patient: [name]  Procedure(s) Performed: Procedure(s) with comments: REVERSE SHOULDER ARTHROPLASTY (Left) - Left total shoulder arthropathy  Patient Location: PACU  Anesthesia Type:General and GA combined with regional for post-op pain  Level of Consciousness: awake, alert , oriented and patient cooperative  Airway & Oxygen Therapy: Patient Spontanous Breathing and Patient connected to nasal cannula oxygen  Post-op Assessment: Report given to RN and Post -op Vital signs reviewed and stable  Post vital signs: Reviewed and stable  Last Vitals:  Vitals:   09/26/16 0604  BP: (!) 133/53  Pulse: 61  Resp: 18  Temp: 36.9 C    Last Pain:  Vitals:   09/26/16 0604  TempSrc: Oral      Patients Stated Pain Goal: 6 ([id number])  Complications: No apparent anesthesia complications

## 2016-09-27 ENCOUNTER — Encounter (HOSPITAL_COMMUNITY): Payer: Self-pay | Admitting: Orthopedic Surgery

## 2016-09-27 LAB — CBC
HCT: 29.5 % — ABNORMAL LOW (ref 36.0–46.0)
HEMOGLOBIN: 9.1 g/dL — AB (ref 12.0–15.0)
MCH: 20.7 pg — ABNORMAL LOW (ref 26.0–34.0)
MCHC: 30.8 g/dL (ref 30.0–36.0)
MCV: 67.2 fL — ABNORMAL LOW (ref 78.0–100.0)
PLATELETS: 213 10*3/uL (ref 150–400)
RBC: 4.39 MIL/uL (ref 3.87–5.11)
RDW: 15.2 % (ref 11.5–15.5)
WBC: 7.6 10*3/uL (ref 4.0–10.5)

## 2016-09-27 LAB — BASIC METABOLIC PANEL
Anion gap: 7 (ref 5–15)
BUN: 9 mg/dL (ref 6–20)
CALCIUM: 8.6 mg/dL — AB (ref 8.9–10.3)
CO2: 25 mmol/L (ref 22–32)
CREATININE: 0.81 mg/dL (ref 0.44–1.00)
Chloride: 106 mmol/L (ref 101–111)
Glucose, Bld: 116 mg/dL — ABNORMAL HIGH (ref 65–99)
Potassium: 3.4 mmol/L — ABNORMAL LOW (ref 3.5–5.1)
SODIUM: 138 mmol/L (ref 135–145)

## 2016-09-27 MED ORDER — DOCUSATE SODIUM 100 MG PO CAPS: 100.0000 mg | ORAL_CAPSULE | Freq: Three times a day (TID) | ORAL | 0 refills | Status: AC | PRN

## 2016-09-27 MED ORDER — OXYCODONE-ACETAMINOPHEN 5-325 MG PO TABS: 1.0000 | ORAL_TABLET | ORAL | 0 refills | Status: AC | PRN

## 2016-09-27 NOTE — Progress Notes (Signed)
   PATIENT ID: [name]   1 Day Post-Op Procedure(s) (LRB): REVERSE SHOULDER ARTHROPLASTY (Left)  Subjective: Doing well, pain controlled. No complaints.  Objective:  Vitals:   09/27/16 0015 09/27/16 0458  BP:  (!) 100/44  Pulse: 69 (!) 57  Resp: 18 16  Temp: 98.7 F (37.1 C) 98.3 F (36.8 C)     L UE dressing c/d/i Wiggles fingers, distally NVI  Labs:   Recent Labs  09/27/16 0432  HGB 9.1*   Recent Labs  09/27/16 0432  WBC 7.6  RBC 4.39  HCT 29.5*  PLT 213   Recent Labs  09/27/16 0432  NA 138  K 3.4*  CL 106  CO2 25  BUN 9  CREATININE 0.81  GLUCOSE 116*  CALCIUM 8.6*    Assessment and Plan: 1 day s/p reverse TSA PROM hand wrist elbow with OT D/c home today when cleared by OT Fu with Dr. Tamera Punt in 2 weeks Scripts in chart  VTE proph: ASA , SCDs

## 2016-09-27 NOTE — Evaluation (Signed)
Occupational Therapy Evaluation and Discharge Patient Details Name: [name] MRN: [id number] DOB: March 22, 1961 Today's Date: 09/27/2016    History of Present Illness 56 y/o female s/p L reverse shoulder arthroplasty. Pt has a past medical history including Anemia; Arthritis; Back pain; Depression; and Neck pain. Pt has a past surgical history that includes Cervical fusion; Shoulder surgery; bunyonectomy; Abdominal hysterectomy; and Tubal ligation.   Clinical Impression   PTA Pt independent in ADL and mobility. Pt currently min A for ADL and supervision for mobility. OT shoulder protocol and HEP reviewed in full. Education complete. Pt and daughter with no questions or concerns at the end of session. OT to sign off at this point. Thank you for the referral.     Follow Up Recommendations  Supervision - Intermittent;No OT follow up (progress rehab of shoulder as ordered by MD at follow up)    Equipment Recommendations  None recommended by OT    Recommendations for Other Services       Precautions / Restrictions Precautions Precautions: Shoulder Type of Shoulder Precautions: conservative protocol Shoulder Interventions: Shoulder sling/immobilizer;At all times;Off for dressing/bathing/exercises Precaution Booklet Issued: Yes (comment) Precaution Comments: OT shoulder protocol reviewed in full Required Braces or Orthoses: Sling Restrictions Weight Bearing Restrictions: Yes LUE Weight Bearing: Non weight bearing      Mobility Bed Mobility Overal bed mobility: Modified Independent             General bed mobility comments: required increased time  Transfers Overall transfer level: Modified independent Equipment used: None             General transfer comment: Pt did not attempt to use LUE to push or assist in transfer    Balance Overall balance assessment: Needs assistance Sitting-balance support: No upper extremity supported;Feet supported Sitting  balance-Leahy Scale: Good Sitting balance - Comments: sitting EOB with no back support   Standing balance support: No upper extremity supported;During functional activity Standing balance-Leahy Scale: Fair Standing balance comment: feeling a little dizzy due to medication during elbow ROM                            ADL Overall ADL's : Needs assistance/impaired Eating/Feeding: Modified independent   Grooming: Wash/dry hands;Oral care;Supervision/safety;Standing                   Toilet Transfer: Min guard;Ambulation;Comfort height toilet   Toileting- Clothing Manipulation and Hygiene: Supervision/safety Toileting - Clothing Manipulation Details (indicate cue type and reason): hospital gown and peri care     Functional mobility during ADLs: Min guard General ADL Comments: Please see shoulder section below for more information about ADL post-op     Vision Vision Assessment?: No apparent visual deficits   Perception     Praxis      Pertinent Vitals/Pain Pain Assessment: 0-10 Pain Score: 7  Pain Location: L shoulder Pain Descriptors / Indicators: Throbbing;Sore;Shooting (every now and then) Pain Intervention(s): Monitored during session;Repositioned;Ice applied;Premedicated before session     Hand Dominance Right   Extremity/Trunk Assessment Upper Extremity Assessment Upper Extremity Assessment: LUE deficits/detail LUE Deficits / Details: post op deficits in strength and ROM LUE Coordination: decreased gross motor   Lower Extremity Assessment Lower Extremity Assessment: Overall WFL for tasks assessed   Cervical / Trunk Assessment Cervical / Trunk Assessment: Other exceptions Cervical / Trunk Exceptions: history of cervical fusion   Communication Communication Communication: No difficulties   Cognition Arousal/Alertness: Awake/alert Behavior During  Therapy: WFL for tasks assessed/performed Overall Cognitive Status: Within Functional Limits for  tasks assessed                     General Comments       Exercises Exercises: Shoulder     Shoulder Instructions Shoulder Instructions Donning/doffing shirt without moving shoulder: Moderate assistance;Patient able to independently direct caregiver;Caregiver independent with task Method for sponge bathing under operated UE: Modified independent Donning/doffing sling/immobilizer: Supervision/safety Correct positioning of sling/immobilizer: Moderate assistance;Caregiver independent with task;Patient able to independently direct caregiver ROM for elbow, wrist and digits of operated UE: Modified independent Sling wearing schedule (on at all times/off for ADL's): Modified independent Proper positioning of operated UE when showering: Modified independent Positioning of UE while sleeping: Modified independent    [place] expects to be discharged to:: Private residence Living Arrangements: Alone Available Help at Discharge: Family;Available PRN/intermittently (son comes and go) Type of Home: House Home Access: Stairs to enter [employer] of Steps: 2 Entrance Stairs-Rails: None Home Layout: One level     Bathroom Shower/Tub: Tub/shower unit Shower/tub characteristics: Architectural technologist: Standard     Home Equipment: None          Prior Functioning/Environment Level of Independence: Independent        Comments: driving        OT Problem List: Decreased strength;Decreased range of motion;Decreased activity tolerance;Decreased knowledge of use of DME or AE;Decreased knowledge of precautions;Impaired UE functional use;Pain   OT Treatment/Interventions:      OT Goals(Current goals can be found in the care plan section) Acute Rehab OT Goals Patient Stated Goal: to be able to have full ROM OT Goal Formulation: With patient Time For Goal Achievement: 10/04/16 Potential to Achieve Goals: Good  OT Frequency:     Barriers to D/C:             Co-evaluation              End of Session Equipment Utilized During Treatment: Gait belt;Other (comment) (sling) Nurse Communication: Mobility status;Weight bearing status;Other (comment) (OT complete. slight dizziness)  Activity Tolerance: Patient tolerated treatment well Patient left: in bed;with call bell/phone within reach;with family/visitor present   Time: 0922-0952 OT Time Calculation (min): 30 min Charges:  OT General Charges $OT Visit: 1 Procedure OT Evaluation $OT Eval Moderate Complexity: 1 Procedure OT Treatments $Self Care/Home Management : 8-22 mins G-Codes:    Merri Ray Dalton Molesworth 09-Oct-2016, 10:49 AM  Hulda Humphrey OTR/L [phone]

## 2016-09-27 NOTE — Progress Notes (Signed)
Discharge instructions printed and reviewed with patient and daughter, and copy given for them to take home. All questions addressed at this time. New prescriptions given to patient to fill at her usual outpatient pharmacy. IV removed. Room searched for patient belongings, and confirmed with patient that all valuables were accounted for. Patient wishes to eat lunch, then she will discharge home. Will continue to monitor.

## 2016-09-30 NOTE — Discharge Summary (Signed)
Patient ID: [name] MRN: [id number] DOB/AGE: [birth year] 56 y.o.  Admit date: 09/26/2016 Discharge date: 09/27/2016  Admission Diagnoses:  Active Problems:   S/P reverse total shoulder arthroplasty, left   Discharge Diagnoses:  Same  Past Medical History:  Diagnosis Date  . Anemia   . Arthritis   . Back pain   . Depression   . GERD (gastroesophageal reflux disease)   . Headache    migraines   Last time "been awhile"  . Heart murmur    15 - 20 yrs ago.  No problems now  . Neck pain     Surgeries: Procedure(s): REVERSE SHOULDER ARTHROPLASTY on 09/26/2016   Consultants:   Discharged Condition: Improved  Hospital Course: [name] is an 56 y.o. female who was admitted 09/26/2016 for operative treatment of left rotator cuff tear arthopathy. Patient has severe unremitting pain that affects sleep, daily activities, and work/hobbies. After pre-op clearance the patient was taken to the operating room on 09/26/2016 and underwent  Procedure(s): [place].    Patient was given perioperative antibiotics:  Anti-infectives    Start     Dose/Rate Route Frequency Ordered Stop   09/26/16 1330  ceFAZolin (ANCEF) IVPB 1 g/50 mL premix     1 g 100 mL/hr over 30 Minutes Intravenous Every 6 hours 09/26/16 1116 09/27/16 0107   09/26/16 0532  ceFAZolin (ANCEF) IVPB 2g/100 mL premix     2 g 200 mL/hr over 30 Minutes Intravenous On call to O.R. 09/26/16 0532 09/26/16 0800       Patient was given sequential compression devices, early ambulation, and asa to prevent DVT.  Patient benefited maximally from hospital stay and there were no complications.    Recent vital signs: No data found.    Recent laboratory studies: No results for input(s): WBC, HGB, HCT, PLT, NA, K, CL, CO2, BUN, CREATININE, GLUCOSE, INR, CALCIUM in the last 72 hours.  Invalid input(s): PT, 2   Discharge Medications:   Allergies as of 09/27/2016      Reactions   Shellfish Allergy Itching       Medication List    STOP taking these medications   aspirin-acetaminophen-caffeine 250-250-65 MG tablet Commonly known as:  EXCEDRIN MIGRAINE   meloxicam 15 MG tablet Commonly known as:  MOBIC   predniSONE 20 MG tablet Commonly known as:  DELTASONE     TAKE these medications   cetirizine 10 MG tablet Commonly known as:  ZYRTEC Take 10 mg by mouth daily.   docusate sodium 100 MG capsule Commonly known as:  COLACE Take 1 capsule (100 mg total) by mouth 3 (three) times daily as needed.   DULoxetine 30 MG capsule Commonly known as:  CYMBALTA Take 30 mg by mouth 2 (two) times daily.   Iron 90 (18 Fe) MG Tabs Take 1 tablet by mouth daily.   LIDOCAINE EX Apply 1 application topically 2 (two) times daily as needed. Pain   oxyCODONE-acetaminophen 5-325 MG tablet Commonly known as:  ROXICET Take 1-2 tablets by mouth every 4 (four) hours as needed for severe pain.   pravastatin 10 MG tablet Commonly known as:  PRAVACHOL Take 20 mg by mouth every evening.   THERA Tabs Take 1 tablet by mouth daily.       Diagnostic Studies: Dg Chest 2 View  Result Date: 09/19/2016 CLINICAL DATA:  Preop shoulder surgery EXAM: CHEST  2 VIEW COMPARISON:  None. FINDINGS: Heart and mediastinal contours are within normal limits. No focal opacities or  effusions. No acute bony abnormality. IMPRESSION: No active cardiopulmonary disease. Electronically Signed   By: Rolm Baptise M.D.   On: 09/19/2016 11:05   Dg Shoulder Left Port  Result Date: 09/26/2016 CLINICAL DATA:  Status post left shoulder replacement EXAM: LEFT SHOULDER - 1 VIEW COMPARISON:  None. FINDINGS: Left shoulder prosthesis is noted in satisfactory position. Chronic changes of the distal left clavicle are noted. No acute abnormality seen. IMPRESSION: Status post left shoulder replacement Electronically Signed   By: Inez Catalina M.D.   On: 09/26/2016 10:12    Disposition: 01-Home or Self Care  Discharge Instructions    Call MD /  Call 911    Complete by:  As directed    If you experience chest pain or shortness of breath, CALL 911 and be transported to the hospital emergency room.  If you develope a fever above 101 F, pus (white drainage) or increased drainage or redness at the wound, or calf pain, call your surgeon's office.   Constipation Prevention    Complete by:  As directed    Drink plenty of fluids.  Prune juice may be helpful.  You may use a stool softener, such as Colace (over the counter) 100 mg twice a day.  Use MiraLax (over the counter) for constipation as needed.   Diet - low sodium heart healthy    Complete by:  As directed    Increase activity slowly as tolerated    Complete by:  As directed       Follow-up Information    Nita Sells, MD. Schedule an appointment as soon as possible for a visit in 2 weeks.   Specialty:  Orthopedic Surgery Contact information: [place] [address] [phone]            Signed: Grier Mitts 09/30/2016, 1:19 PM

## 2016-10-17 DIAGNOSIS — H40003 Preglaucoma, unspecified, bilateral: Secondary | ICD-10-CM | POA: Diagnosis not present

## 2016-10-29 DIAGNOSIS — H25811 Combined forms of age-related cataract, right eye: Secondary | ICD-10-CM | POA: Diagnosis not present

## 2016-11-14 DIAGNOSIS — N951 Menopausal and female climacteric states: Secondary | ICD-10-CM | POA: Diagnosis not present

## 2016-11-14 DIAGNOSIS — D508 Other iron deficiency anemias: Secondary | ICD-10-CM | POA: Diagnosis not present

## 2016-11-14 DIAGNOSIS — Z1389 Encounter for screening for other disorder: Secondary | ICD-10-CM | POA: Diagnosis not present

## 2016-11-14 DIAGNOSIS — Z6823 Body mass index (BMI) 23.0-23.9, adult: Secondary | ICD-10-CM | POA: Diagnosis not present

## 2016-11-14 DIAGNOSIS — Z01419 Encounter for gynecological examination (general) (routine) without abnormal findings: Secondary | ICD-10-CM | POA: Diagnosis not present

## 2016-11-14 DIAGNOSIS — Z130 Encounter for screening for diseases of the blood and blood-forming organs and certain disorders involving the immune mechanism: Secondary | ICD-10-CM | POA: Diagnosis not present

## 2016-12-27 DIAGNOSIS — Z1159 Encounter for screening for other viral diseases: Secondary | ICD-10-CM | POA: Diagnosis not present

## 2016-12-27 DIAGNOSIS — J309 Allergic rhinitis, unspecified: Secondary | ICD-10-CM | POA: Diagnosis not present

## 2016-12-27 DIAGNOSIS — S90562A Insect bite (nonvenomous), left ankle, initial encounter: Secondary | ICD-10-CM | POA: Diagnosis not present

## 2016-12-27 DIAGNOSIS — E7800 Pure hypercholesterolemia, unspecified: Secondary | ICD-10-CM | POA: Diagnosis not present

## 2016-12-27 DIAGNOSIS — G43909 Migraine, unspecified, not intractable, without status migrainosus: Secondary | ICD-10-CM | POA: Diagnosis not present

## 2017-01-10 NOTE — Addendum Note (Signed)
Addendum  created 01/10/17 1016 by Effie Berkshire, MD   Sign clinical note

## 2017-01-31 DIAGNOSIS — K080 Exfoliation of teeth due to systemic causes: Secondary | ICD-10-CM | POA: Diagnosis not present

## 2017-03-27 DIAGNOSIS — K080 Exfoliation of teeth due to systemic causes: Secondary | ICD-10-CM | POA: Diagnosis not present

## 2017-06-03 DIAGNOSIS — E7800 Pure hypercholesterolemia, unspecified: Secondary | ICD-10-CM | POA: Diagnosis not present

## 2017-06-03 DIAGNOSIS — G43909 Migraine, unspecified, not intractable, without status migrainosus: Secondary | ICD-10-CM | POA: Diagnosis not present

## 2017-06-04 DIAGNOSIS — K080 Exfoliation of teeth due to systemic causes: Secondary | ICD-10-CM | POA: Diagnosis not present

## 2018-05-13 DIAGNOSIS — J069 Acute upper respiratory infection, unspecified: Secondary | ICD-10-CM | POA: Diagnosis not present

## 2018-05-27 DIAGNOSIS — E7800 Pure hypercholesterolemia, unspecified: Secondary | ICD-10-CM | POA: Diagnosis not present

## 2018-05-27 DIAGNOSIS — Z23 Encounter for immunization: Secondary | ICD-10-CM | POA: Diagnosis not present

## 2018-07-13 DIAGNOSIS — E7800 Pure hypercholesterolemia, unspecified: Secondary | ICD-10-CM | POA: Diagnosis not present

## 2018-07-13 DIAGNOSIS — G43909 Migraine, unspecified, not intractable, without status migrainosus: Secondary | ICD-10-CM | POA: Diagnosis not present

## 2018-07-13 DIAGNOSIS — J309 Allergic rhinitis, unspecified: Secondary | ICD-10-CM | POA: Diagnosis not present

## 2018-09-29 DIAGNOSIS — G43909 Migraine, unspecified, not intractable, without status migrainosus: Secondary | ICD-10-CM | POA: Diagnosis not present

## 2018-10-03 IMAGING — DX DG SHOULDER 1V*L*
1 series · 1 of 1 positions shown · non-contrast
Comparison: None.

CLINICAL DATA: Status post left shoulder replacement

EXAM:
LEFT SHOULDER - 1 VIEW

[shoulder ap]
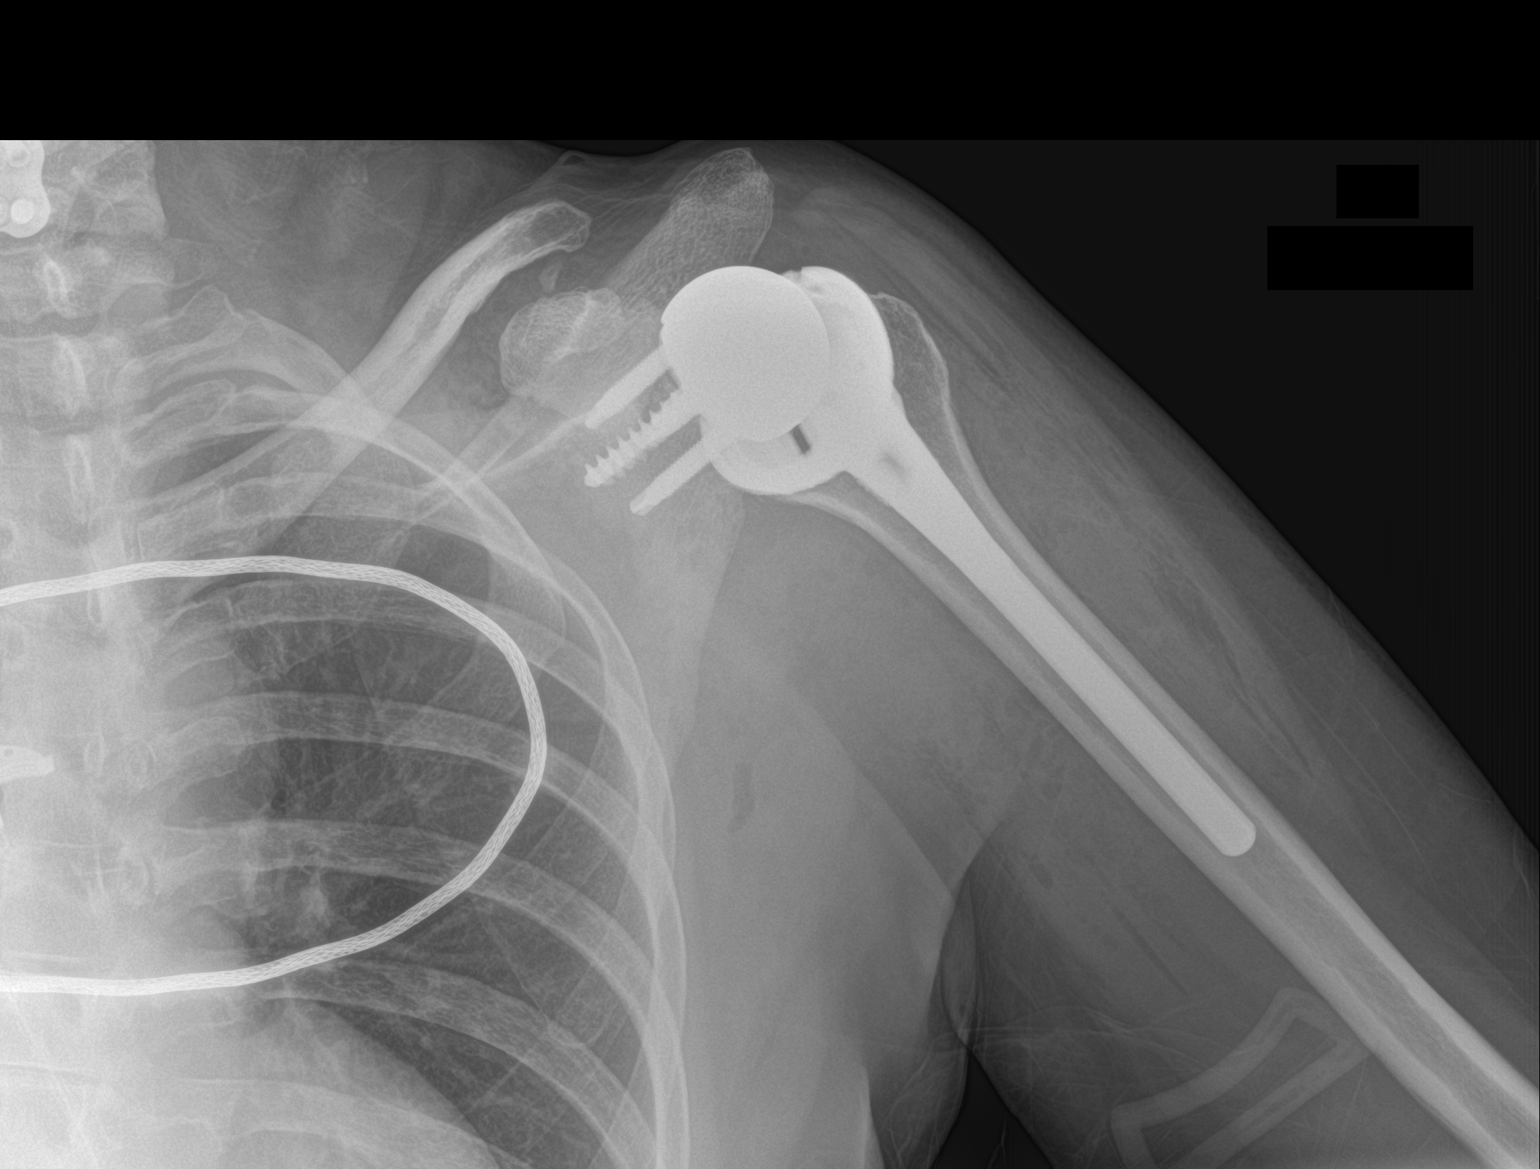

[1 of 1 positions shown; findings below may reference images not displayed]

FINDINGS: Left shoulder prosthesis is noted in satisfactory position. Chronic
changes of the distal left clavicle are noted. No acute abnormality
seen.
IMPRESSION: Status post left shoulder replacement

## 2018-10-16 DIAGNOSIS — Z1231 Encounter for screening mammogram for malignant neoplasm of breast: Secondary | ICD-10-CM | POA: Diagnosis not present

## 2018-10-19 DIAGNOSIS — Z124 Encounter for screening for malignant neoplasm of cervix: Secondary | ICD-10-CM | POA: Diagnosis not present

## 2018-10-19 DIAGNOSIS — Z01419 Encounter for gynecological examination (general) (routine) without abnormal findings: Secondary | ICD-10-CM | POA: Diagnosis not present

## 2018-10-20 DIAGNOSIS — Z124 Encounter for screening for malignant neoplasm of cervix: Secondary | ICD-10-CM | POA: Diagnosis not present

## 2019-01-12 DIAGNOSIS — E7800 Pure hypercholesterolemia, unspecified: Secondary | ICD-10-CM | POA: Diagnosis not present

## 2019-01-12 DIAGNOSIS — Z1389 Encounter for screening for other disorder: Secondary | ICD-10-CM | POA: Diagnosis not present

## 2019-05-17 DIAGNOSIS — H269 Unspecified cataract: Secondary | ICD-10-CM | POA: Diagnosis not present

## 2019-05-17 DIAGNOSIS — Z01818 Encounter for other preprocedural examination: Secondary | ICD-10-CM | POA: Diagnosis not present

## 2019-05-17 DIAGNOSIS — H40003 Preglaucoma, unspecified, bilateral: Secondary | ICD-10-CM | POA: Diagnosis not present

## 2019-05-27 DIAGNOSIS — H2511 Age-related nuclear cataract, right eye: Secondary | ICD-10-CM | POA: Diagnosis not present

## 2019-05-27 DIAGNOSIS — H269 Unspecified cataract: Secondary | ICD-10-CM | POA: Diagnosis not present

## 2019-05-27 DIAGNOSIS — H25811 Combined forms of age-related cataract, right eye: Secondary | ICD-10-CM | POA: Diagnosis not present

## 2019-06-18 DIAGNOSIS — H2512 Age-related nuclear cataract, left eye: Secondary | ICD-10-CM | POA: Diagnosis not present

## 2019-06-18 DIAGNOSIS — H25812 Combined forms of age-related cataract, left eye: Secondary | ICD-10-CM | POA: Diagnosis not present

## 2019-07-19 DIAGNOSIS — E7800 Pure hypercholesterolemia, unspecified: Secondary | ICD-10-CM | POA: Diagnosis not present

## 2019-07-19 DIAGNOSIS — G43909 Migraine, unspecified, not intractable, without status migrainosus: Secondary | ICD-10-CM | POA: Diagnosis not present

## 2019-07-19 DIAGNOSIS — J309 Allergic rhinitis, unspecified: Secondary | ICD-10-CM | POA: Diagnosis not present

## 2019-12-17 DIAGNOSIS — Z23 Encounter for immunization: Secondary | ICD-10-CM | POA: Diagnosis not present

## 2020-01-14 DIAGNOSIS — Z23 Encounter for immunization: Secondary | ICD-10-CM | POA: Diagnosis not present

## 2020-06-02 DIAGNOSIS — J309 Allergic rhinitis, unspecified: Secondary | ICD-10-CM | POA: Diagnosis not present

## 2020-06-02 DIAGNOSIS — E7800 Pure hypercholesterolemia, unspecified: Secondary | ICD-10-CM | POA: Diagnosis not present

## 2020-06-02 DIAGNOSIS — Z23 Encounter for immunization: Secondary | ICD-10-CM | POA: Diagnosis not present

## 2020-07-18 DIAGNOSIS — H1013 Acute atopic conjunctivitis, bilateral: Secondary | ICD-10-CM | POA: Diagnosis not present

## 2020-07-18 DIAGNOSIS — Z961 Presence of intraocular lens: Secondary | ICD-10-CM | POA: Diagnosis not present

## 2020-08-01 DIAGNOSIS — Z0000 Encounter for general adult medical examination without abnormal findings: Secondary | ICD-10-CM | POA: Diagnosis not present

## 2020-08-01 DIAGNOSIS — Z1389 Encounter for screening for other disorder: Secondary | ICD-10-CM | POA: Diagnosis not present

## 2020-09-29 DIAGNOSIS — H16223 Keratoconjunctivitis sicca, not specified as Sjogren's, bilateral: Secondary | ICD-10-CM | POA: Diagnosis not present

## 2020-10-18 DIAGNOSIS — Z01419 Encounter for gynecological examination (general) (routine) without abnormal findings: Secondary | ICD-10-CM | POA: Diagnosis not present

## 2020-10-31 DIAGNOSIS — Z1231 Encounter for screening mammogram for malignant neoplasm of breast: Secondary | ICD-10-CM | POA: Diagnosis not present

## 2020-11-09 DIAGNOSIS — Z20822 Contact with and (suspected) exposure to covid-19: Secondary | ICD-10-CM | POA: Diagnosis not present

## 2020-11-09 DIAGNOSIS — Z03818 Encounter for observation for suspected exposure to other biological agents ruled out: Secondary | ICD-10-CM | POA: Diagnosis not present

## 2020-11-24 DIAGNOSIS — Z03818 Encounter for observation for suspected exposure to other biological agents ruled out: Secondary | ICD-10-CM | POA: Diagnosis not present

## 2021-01-30 DIAGNOSIS — E7800 Pure hypercholesterolemia, unspecified: Secondary | ICD-10-CM | POA: Diagnosis not present

## 2021-01-30 DIAGNOSIS — G43909 Migraine, unspecified, not intractable, without status migrainosus: Secondary | ICD-10-CM | POA: Diagnosis not present

## 2021-01-30 DIAGNOSIS — J309 Allergic rhinitis, unspecified: Secondary | ICD-10-CM | POA: Diagnosis not present

## 2021-06-26 DIAGNOSIS — H40003 Preglaucoma, unspecified, bilateral: Secondary | ICD-10-CM | POA: Diagnosis not present

## 2021-08-29 DIAGNOSIS — E7800 Pure hypercholesterolemia, unspecified: Secondary | ICD-10-CM | POA: Diagnosis not present

## 2021-08-29 DIAGNOSIS — Z0000 Encounter for general adult medical examination without abnormal findings: Secondary | ICD-10-CM | POA: Diagnosis not present

## 2021-08-29 DIAGNOSIS — J309 Allergic rhinitis, unspecified: Secondary | ICD-10-CM | POA: Diagnosis not present

## 2021-08-29 DIAGNOSIS — Z1389 Encounter for screening for other disorder: Secondary | ICD-10-CM | POA: Diagnosis not present

## 2021-08-29 DIAGNOSIS — R1013 Epigastric pain: Secondary | ICD-10-CM | POA: Diagnosis not present

## 2021-08-29 DIAGNOSIS — Z1211 Encounter for screening for malignant neoplasm of colon: Secondary | ICD-10-CM | POA: Diagnosis not present

## 2021-09-26 DIAGNOSIS — Z961 Presence of intraocular lens: Secondary | ICD-10-CM | POA: Diagnosis not present

## 2021-09-26 DIAGNOSIS — H04129 Dry eye syndrome of unspecified lacrimal gland: Secondary | ICD-10-CM | POA: Diagnosis not present

## 2021-11-06 DIAGNOSIS — Z780 Asymptomatic menopausal state: Secondary | ICD-10-CM | POA: Diagnosis not present

## 2021-11-06 DIAGNOSIS — Z1231 Encounter for screening mammogram for malignant neoplasm of breast: Secondary | ICD-10-CM | POA: Diagnosis not present

## 2021-12-21 DIAGNOSIS — H6121 Impacted cerumen, right ear: Secondary | ICD-10-CM | POA: Diagnosis not present

## 2022-02-26 DIAGNOSIS — E7800 Pure hypercholesterolemia, unspecified: Secondary | ICD-10-CM | POA: Diagnosis not present

## 2022-02-26 DIAGNOSIS — G43009 Migraine without aura, not intractable, without status migrainosus: Secondary | ICD-10-CM | POA: Diagnosis not present

## 2022-02-26 DIAGNOSIS — J309 Allergic rhinitis, unspecified: Secondary | ICD-10-CM | POA: Diagnosis not present

## 2022-03-04 DIAGNOSIS — K5730 Diverticulosis of large intestine without perforation or abscess without bleeding: Secondary | ICD-10-CM | POA: Diagnosis not present

## 2022-03-04 DIAGNOSIS — Z1211 Encounter for screening for malignant neoplasm of colon: Secondary | ICD-10-CM | POA: Diagnosis not present

## 2022-03-04 DIAGNOSIS — D122 Benign neoplasm of ascending colon: Secondary | ICD-10-CM | POA: Diagnosis not present

## 2022-03-04 DIAGNOSIS — K648 Other hemorrhoids: Secondary | ICD-10-CM | POA: Diagnosis not present

## 2022-03-06 DIAGNOSIS — D122 Benign neoplasm of ascending colon: Secondary | ICD-10-CM | POA: Diagnosis not present

## 2022-06-26 DIAGNOSIS — H40003 Preglaucoma, unspecified, bilateral: Secondary | ICD-10-CM | POA: Diagnosis not present

## 2022-06-26 DIAGNOSIS — H04129 Dry eye syndrome of unspecified lacrimal gland: Secondary | ICD-10-CM | POA: Diagnosis not present

## 2022-09-18 DIAGNOSIS — R030 Elevated blood-pressure reading, without diagnosis of hypertension: Secondary | ICD-10-CM | POA: Diagnosis not present

## 2022-09-18 DIAGNOSIS — Z0000 Encounter for general adult medical examination without abnormal findings: Secondary | ICD-10-CM | POA: Diagnosis not present

## 2022-09-18 DIAGNOSIS — Z1331 Encounter for screening for depression: Secondary | ICD-10-CM | POA: Diagnosis not present

## 2022-09-18 DIAGNOSIS — E7800 Pure hypercholesterolemia, unspecified: Secondary | ICD-10-CM | POA: Diagnosis not present

## 2022-10-28 DIAGNOSIS — Z1151 Encounter for screening for human papillomavirus (HPV): Secondary | ICD-10-CM | POA: Diagnosis not present

## 2022-10-28 DIAGNOSIS — Z124 Encounter for screening for malignant neoplasm of cervix: Secondary | ICD-10-CM | POA: Diagnosis not present

## 2022-11-08 DIAGNOSIS — Z1231 Encounter for screening mammogram for malignant neoplasm of breast: Secondary | ICD-10-CM | POA: Diagnosis not present

## 2023-03-19 DIAGNOSIS — K30 Functional dyspepsia: Secondary | ICD-10-CM | POA: Diagnosis not present

## 2023-03-19 DIAGNOSIS — E7800 Pure hypercholesterolemia, unspecified: Secondary | ICD-10-CM | POA: Diagnosis not present

## 2023-03-19 DIAGNOSIS — J309 Allergic rhinitis, unspecified: Secondary | ICD-10-CM | POA: Diagnosis not present

## 2023-03-19 DIAGNOSIS — G43009 Migraine without aura, not intractable, without status migrainosus: Secondary | ICD-10-CM | POA: Diagnosis not present

## 2023-04-22 DIAGNOSIS — Z23 Encounter for immunization: Secondary | ICD-10-CM | POA: Diagnosis not present

## 2023-09-19 DIAGNOSIS — G43909 Migraine, unspecified, not intractable, without status migrainosus: Secondary | ICD-10-CM | POA: Diagnosis not present

## 2023-09-19 DIAGNOSIS — I10 Essential (primary) hypertension: Secondary | ICD-10-CM | POA: Diagnosis not present

## 2023-09-19 DIAGNOSIS — J309 Allergic rhinitis, unspecified: Secondary | ICD-10-CM | POA: Diagnosis not present

## 2023-09-19 DIAGNOSIS — W19XXXA Unspecified fall, initial encounter: Secondary | ICD-10-CM | POA: Diagnosis not present

## 2023-09-19 DIAGNOSIS — S40022A Contusion of left upper arm, initial encounter: Secondary | ICD-10-CM | POA: Diagnosis not present

## 2023-10-15 DIAGNOSIS — I10 Essential (primary) hypertension: Secondary | ICD-10-CM | POA: Diagnosis not present

## 2023-10-15 DIAGNOSIS — Z0000 Encounter for general adult medical examination without abnormal findings: Secondary | ICD-10-CM | POA: Diagnosis not present

## 2023-10-15 DIAGNOSIS — Z1331 Encounter for screening for depression: Secondary | ICD-10-CM | POA: Diagnosis not present

## 2023-10-15 DIAGNOSIS — E7800 Pure hypercholesterolemia, unspecified: Secondary | ICD-10-CM | POA: Diagnosis not present

## 2023-10-15 DIAGNOSIS — Z7184 Encounter for health counseling related to travel: Secondary | ICD-10-CM | POA: Diagnosis not present

## 2023-10-29 DIAGNOSIS — Z01419 Encounter for gynecological examination (general) (routine) without abnormal findings: Secondary | ICD-10-CM | POA: Diagnosis not present

## 2023-10-29 DIAGNOSIS — N3946 Mixed incontinence: Secondary | ICD-10-CM | POA: Diagnosis not present

## 2023-11-10 DIAGNOSIS — E7800 Pure hypercholesterolemia, unspecified: Secondary | ICD-10-CM | POA: Diagnosis not present

## 2023-11-10 DIAGNOSIS — Z1231 Encounter for screening mammogram for malignant neoplasm of breast: Secondary | ICD-10-CM | POA: Diagnosis not present

## 2023-12-10 DIAGNOSIS — E7800 Pure hypercholesterolemia, unspecified: Secondary | ICD-10-CM | POA: Diagnosis not present

## 2024-01-10 DIAGNOSIS — E7800 Pure hypercholesterolemia, unspecified: Secondary | ICD-10-CM | POA: Diagnosis not present

## 2024-03-11 DIAGNOSIS — E7800 Pure hypercholesterolemia, unspecified: Secondary | ICD-10-CM | POA: Diagnosis not present

## 2024-04-11 DIAGNOSIS — E7800 Pure hypercholesterolemia, unspecified: Secondary | ICD-10-CM | POA: Diagnosis not present

## 2024-04-16 DIAGNOSIS — E7800 Pure hypercholesterolemia, unspecified: Secondary | ICD-10-CM | POA: Diagnosis not present

## 2024-04-16 DIAGNOSIS — J309 Allergic rhinitis, unspecified: Secondary | ICD-10-CM | POA: Diagnosis not present

## 2024-04-16 DIAGNOSIS — Z23 Encounter for immunization: Secondary | ICD-10-CM | POA: Diagnosis not present

## 2024-04-16 DIAGNOSIS — G43009 Migraine without aura, not intractable, without status migrainosus: Secondary | ICD-10-CM | POA: Diagnosis not present

## 2024-04-16 DIAGNOSIS — I10 Essential (primary) hypertension: Secondary | ICD-10-CM | POA: Diagnosis not present

## 2024-05-11 DIAGNOSIS — E7800 Pure hypercholesterolemia, unspecified: Secondary | ICD-10-CM | POA: Diagnosis not present

## 2024-06-11 DIAGNOSIS — E7800 Pure hypercholesterolemia, unspecified: Secondary | ICD-10-CM | POA: Diagnosis not present

## 2024-07-11 DIAGNOSIS — E7800 Pure hypercholesterolemia, unspecified: Secondary | ICD-10-CM | POA: Diagnosis not present
